# Patient Record
Sex: Female | Born: 1975 | Race: White | Hispanic: No | Marital: Single | Smoking: Never smoker
Health system: Southern US, Community
[De-identification: ages and names within clinical notes are randomized; demographics above are authoritative.]

## PROBLEM LIST (undated history)

## (undated) DIAGNOSIS — E282 Polycystic ovarian syndrome: Secondary | ICD-10-CM

## (undated) DIAGNOSIS — N189 Chronic kidney disease, unspecified: Secondary | ICD-10-CM

## (undated) DIAGNOSIS — E119 Type 2 diabetes mellitus without complications: Secondary | ICD-10-CM

## (undated) DIAGNOSIS — I10 Essential (primary) hypertension: Secondary | ICD-10-CM

## (undated) DIAGNOSIS — E042 Nontoxic multinodular goiter: Secondary | ICD-10-CM

## (undated) DIAGNOSIS — M51379 Other intervertebral disc degeneration, lumbosacral region without mention of lumbar back pain or lower extremity pain: Secondary | ICD-10-CM

## (undated) DIAGNOSIS — G8929 Other chronic pain: Secondary | ICD-10-CM

## (undated) DIAGNOSIS — M5137 Other intervertebral disc degeneration, lumbosacral region: Secondary | ICD-10-CM

## (undated) DIAGNOSIS — T8859XA Other complications of anesthesia, initial encounter: Secondary | ICD-10-CM

## (undated) DIAGNOSIS — Z87442 Personal history of urinary calculi: Secondary | ICD-10-CM

## (undated) DIAGNOSIS — D649 Anemia, unspecified: Secondary | ICD-10-CM

## (undated) DIAGNOSIS — F419 Anxiety disorder, unspecified: Secondary | ICD-10-CM

## (undated) DIAGNOSIS — J189 Pneumonia, unspecified organism: Secondary | ICD-10-CM

## (undated) DIAGNOSIS — F32A Depression, unspecified: Secondary | ICD-10-CM

---

## 1998-05-27 ENCOUNTER — Other Ambulatory Visit: Admission: RE | Admit: 1998-05-27 | Discharge: 1998-05-27 | Payer: Self-pay | Admitting: Obstetrics and Gynecology

## 1998-10-02 ENCOUNTER — Emergency Department (HOSPITAL_COMMUNITY): Admission: EM | Admit: 1998-10-02 | Discharge: 1998-10-02 | Payer: Self-pay | Admitting: *Deleted

## 1999-05-17 ENCOUNTER — Emergency Department (HOSPITAL_COMMUNITY): Admission: EM | Admit: 1999-05-17 | Discharge: 1999-05-17 | Payer: Self-pay | Admitting: Emergency Medicine

## 2003-06-08 ENCOUNTER — Emergency Department (HOSPITAL_COMMUNITY): Admission: EM | Admit: 2003-06-08 | Discharge: 2003-06-09 | Payer: Self-pay | Admitting: Emergency Medicine

## 2003-06-09 ENCOUNTER — Encounter: Payer: Self-pay | Admitting: Emergency Medicine

## 2006-07-11 ENCOUNTER — Emergency Department (HOSPITAL_COMMUNITY): Admission: EM | Admit: 2006-07-11 | Discharge: 2006-07-11 | Payer: Self-pay | Admitting: Family Medicine

## 2006-08-01 ENCOUNTER — Emergency Department (HOSPITAL_COMMUNITY): Admission: EM | Admit: 2006-08-01 | Discharge: 2006-08-01 | Payer: Self-pay | Admitting: Family Medicine

## 2006-11-15 ENCOUNTER — Emergency Department (HOSPITAL_COMMUNITY): Admission: EM | Admit: 2006-11-15 | Discharge: 2006-11-15 | Payer: Self-pay | Admitting: Family Medicine

## 2007-09-09 ENCOUNTER — Emergency Department (HOSPITAL_COMMUNITY): Admission: EM | Admit: 2007-09-09 | Discharge: 2007-09-09 | Payer: Self-pay | Admitting: Family Medicine

## 2008-06-23 ENCOUNTER — Emergency Department (HOSPITAL_BASED_OUTPATIENT_CLINIC_OR_DEPARTMENT_OTHER): Admission: EM | Admit: 2008-06-23 | Discharge: 2008-06-23 | Payer: Self-pay | Admitting: Emergency Medicine

## 2009-07-11 ENCOUNTER — Emergency Department (HOSPITAL_COMMUNITY): Admission: EM | Admit: 2009-07-11 | Discharge: 2009-07-11 | Payer: Self-pay | Admitting: Emergency Medicine

## 2010-03-23 ENCOUNTER — Emergency Department (HOSPITAL_BASED_OUTPATIENT_CLINIC_OR_DEPARTMENT_OTHER): Admission: EM | Admit: 2010-03-23 | Discharge: 2010-03-23 | Payer: Self-pay | Admitting: Emergency Medicine

## 2010-03-23 ENCOUNTER — Ambulatory Visit: Payer: Self-pay | Admitting: Diagnostic Radiology

## 2010-10-02 ENCOUNTER — Emergency Department (HOSPITAL_COMMUNITY)
Admission: EM | Admit: 2010-10-02 | Discharge: 2010-10-02 | Payer: Self-pay | Source: Home / Self Care | Admitting: Emergency Medicine

## 2010-12-29 LAB — URINALYSIS, ROUTINE W REFLEX MICROSCOPIC
Hgb urine dipstick: NEGATIVE
Specific Gravity, Urine: 1.011 (ref 1.005–1.030)
Urobilinogen, UA: 1 mg/dL (ref 0.0–1.0)
pH: 7 (ref 5.0–8.0)

## 2010-12-29 LAB — COMPREHENSIVE METABOLIC PANEL
ALT: 37 U/L — ABNORMAL HIGH (ref 0–35)
AST: 29 U/L (ref 0–37)
Albumin: 3.8 g/dL (ref 3.5–5.2)
Alkaline Phosphatase: 110 U/L (ref 39–117)
CO2: 27 mEq/L (ref 19–32)
Chloride: 102 mEq/L (ref 96–112)
GFR calc Af Amer: >60 mL/min (ref >60)
GFR calc non Af Amer: >60 mL/min (ref >60)
Potassium: 3.8 mEq/L (ref 3.5–5.1)
Sodium: 136 mEq/L (ref 135–145)
Total Bilirubin: 1.5 mg/dL — ABNORMAL HIGH (ref 0.3–1.2)

## 2010-12-29 LAB — DIFFERENTIAL
Basophils Absolute: 0.1 10*3/uL (ref 0.0–0.1)
Eosinophils Absolute: 0.2 10*3/uL (ref 0.0–0.7)
Eosinophils Relative: 3 % (ref 0–5)
Lymphocytes Relative: 20 % (ref 12–46)
Monocytes Absolute: 0.4 10*3/uL (ref 0.1–1.0)

## 2010-12-29 LAB — HEMOCCULT GUIAC POC 1CARD (OFFICE): Fecal Occult Bld: NEGATIVE

## 2010-12-29 LAB — CBC
MCV: 88.6 fL (ref 78.0–100.0)
Platelets: 208 10*3/uL (ref 150–400)
RBC: 4.91 MIL/uL (ref 3.87–5.11)
WBC: 8.5 10*3/uL (ref 4.0–10.5)

## 2010-12-29 LAB — URINE MICROSCOPIC-ADD ON

## 2011-06-26 LAB — CBC
HCT: 41.5
MCV: 87.5
Platelets: 259
RDW: 12
WBC: 8.8

## 2011-06-26 LAB — POCT CARDIAC MARKERS
CKMB, poc: 2.2
Myoglobin, poc: 43.1
Myoglobin, poc: 50.2
Troponin i, poc: <0.05

## 2011-06-26 LAB — DIFFERENTIAL
Eosinophils Relative: 2
Lymphocytes Relative: 28
Monocytes Absolute: 0.5
Monocytes Relative: 6
Neutro Abs: 5.7

## 2011-06-26 LAB — COMPREHENSIVE METABOLIC PANEL
AST: 32
Albumin: 4.6
BUN: 12
Chloride: 106
Creatinine, Ser: 0.8
GFR calc Af Amer: >60
Potassium: 3.7
Total Protein: 8

## 2011-06-26 LAB — PROTIME-INR
INR: 1
Prothrombin Time: 13

## 2011-06-30 LAB — POCT URINALYSIS DIP (DEVICE)
Glucose, UA: NEGATIVE
Ketones, ur: NEGATIVE
Operator id: 235561
Protein, ur: NEGATIVE

## 2011-06-30 LAB — POCT RAPID STREP A: Streptococcus, Group A Screen (Direct): NEGATIVE

## 2012-02-24 ENCOUNTER — Emergency Department (HOSPITAL_COMMUNITY)
Admission: EM | Admit: 2012-02-24 | Discharge: 2012-02-24 | Disposition: A | Discharge status: Home / Self Care | Payer: Self-pay | Attending: Emergency Medicine | Admitting: Emergency Medicine

## 2012-02-24 ENCOUNTER — Encounter (HOSPITAL_COMMUNITY): Payer: Self-pay | Admitting: Emergency Medicine

## 2012-02-24 DIAGNOSIS — I10 Essential (primary) hypertension: Secondary | ICD-10-CM | POA: Insufficient documentation

## 2012-02-24 DIAGNOSIS — R51 Headache: Secondary | ICD-10-CM | POA: Insufficient documentation

## 2012-02-24 HISTORY — DX: Essential (primary) hypertension: I10

## 2012-02-24 MED ORDER — METOCLOPRAMIDE HCL 5 MG/ML IJ SOLN
10.0000 mg | Freq: Once | INTRAMUSCULAR | Status: AC
  Administered 2012-02-24: 10 mg via INTRAMUSCULAR
  Filled 2012-02-24: qty 2

## 2012-02-24 MED ORDER — DEXAMETHASONE SODIUM PHOSPHATE 10 MG/ML IJ SOLN
10.0000 mg | Freq: Once | INTRAMUSCULAR | Status: AC
  Administered 2012-02-24: 10 mg via INTRAMUSCULAR
  Filled 2012-02-24: qty 1

## 2012-02-24 MED ORDER — DIPHENHYDRAMINE HCL 50 MG/ML IJ SOLN
25.0000 mg | Freq: Once | INTRAMUSCULAR | Status: AC
  Administered 2012-02-24: 25 mg via INTRAMUSCULAR
  Filled 2012-02-24: qty 1

## 2012-02-24 NOTE — ED Provider Notes (Signed)
History     CSN: [id number]  Arrival date & time 02/24/12  2028   First MD Initiated Contact with Patient 02/24/12 2131      Chief Complaint  Patient presents with  . Headache    (Consider location/radiation/quality/duration/timing/severity/associated sxs/prior treatment) HPI Comments: Patient reports that she has had intermittent headaches over the past 2 months.  She has had some increased stress at work.  Pain is located in the temporal region bilaterally and goes around her head in a band like pattern.  Pain is typically relieved with Ibuprofen.  She denies any ataxia, vision changes, fever, neck pain/stiffness, dizziness, or lightheadedness.  No head injury.  She reports that the pain today is similar to the pain that she has had in the past.  She reports that she had an episode of vomiting this morning.. She is requesting a note for missing work today.   Patient is a 36 y.o. female presenting with headaches. The history is provided by the patient.  Headache  This is a recurrent problem. The headache is associated with nothing. Pain location: bilateral temporal region, band like pain around head. The pain does not radiate. Associated symptoms include nausea and vomiting. Pertinent negatives include no fever, no near-syncope, no syncope and no shortness of breath. She has tried NSAIDs for the symptoms. The treatment provided mild relief.    Past Medical History  Diagnosis Date  . Hypertension     History reviewed. No pertinent past surgical history.  Family History  Problem Relation Age of Onset  . Diabetes Mother   . Heart failure Father   . Cancer Father     History  Substance Use Topics  . Smoking status: Never Smoker   . Smokeless tobacco: Not on file  . Alcohol Use: No    OB History    Grav Para Term Preterm Abortions TAB SAB Ect Mult Living                  Review of Systems  Constitutional: Negative for fever and chills.  HENT: Negative for neck pain and  neck stiffness.   Eyes: Negative for photophobia, pain and visual disturbance.  Respiratory: Negative for shortness of breath.   Cardiovascular: Negative for syncope and near-syncope.  Gastrointestinal: Positive for nausea and vomiting. Negative for abdominal pain.  Musculoskeletal: Negative for gait problem.  Skin: Negative for rash.  Neurological: Positive for headaches. Negative for dizziness, tremors, syncope, facial asymmetry, speech difficulty, weakness, light-headedness and numbness.  Psychiatric/Behavioral: Negative for confusion.    Allergies  Review of patient's allergies indicates no known allergies.  Home Medications   Current Outpatient Rx  Name Route Sig Dispense Refill  . FLUOXETINE HCL 40 MG PO CAPS Oral Take 40 mg by mouth daily.    . IBUPROFEN 200 MG PO TABS Oral Take 600 mg by mouth every 6 (six) hours as needed. Pain      BP 146/79  Pulse 89  Temp(Src) 98.6 F (37 C) (Oral)  Resp 18  SpO2 98%  Physical Exam  Nursing note and vitals reviewed. Constitutional: She is oriented to person, place, and time. She appears well-developed and well-nourished. No distress.  HENT:  Head: Normocephalic and atraumatic.  Mouth/Throat: Oropharynx is clear and moist.  Eyes: EOM are normal. Pupils are equal, round, and reactive to light.  Neck: Normal range of motion. Neck supple.  Cardiovascular: Normal rate, regular rhythm and normal heart sounds.   Pulmonary/Chest: Effort normal and breath sounds normal.  Abdominal:  There is no tenderness.  Musculoskeletal: Normal range of motion.  Neurological: She is alert and oriented to person, place, and time. She has normal strength. No cranial nerve deficit or sensory deficit. She displays a negative Romberg sign. Coordination and gait normal.  Reflex Scores:      Brachioradialis reflexes are 2+ on the right side and 2+ on the left side.      Patellar reflexes are 2+ on the right side and 2+ on the left side.      Normal finger  to nose testing No ataxia with ambulation Normal rapid alternating movements Normal visual fields  Skin: Skin is warm and dry. No rash noted. She is not diaphoretic.  Psychiatric: She has a normal mood and affect.    ED Course  Procedures (including critical care time)  Labs Reviewed - No data to display No results found.   No diagnosis found.  11:16 PM Reassessed patient.  She reports that her headache has improved at this time.  MDM  Pt HA treated and improved while in ED.  Presentation is like pts typical HA and non concerning for [facility], ICH, Meningitis, or temporal arteritis. Pt is afebrile with no focal neuro deficits, nuchal rigidity, or change in vision. Pt is to follow up with PCP. Pt verbalizes understanding and is agreeable with plan to dc.         Sherlyn Lees Corning, PA-C 02/25/12 1159

## 2012-02-24 NOTE — Discharge Instructions (Signed)
General Headache, Without Cause A general headache has no specific cause. These headaches are not life-threatening. They will not lead to other types of headaches. HOME CARE   Make and keep follow-up visits with your doctor.   Only take medicine as told by your doctor.   Try to relax, get a massage, or use your thoughts to control your body (biofeedback).   Apply cold or heat to the head and neck. Apply 3 or 4 times a day or as needed.   GET HELP RIGHT AWAY IF:   You have problems with medicine.   Your medicine does not help relieve pain.   Your headache changes or becomes worse.   You feel sick to your stomach (nauseous) or throw up (vomit).   You have a temperature by mouth above 102 F (38.9 C), not controlled by medicine.   Your have a stiff neck.   You have vision loss.   You have muscle weakness.   You lose control of your muscles.   You lose balance or have trouble walking.   You feel like you are going to pass out (faint).  MAKE SURE YOU:   Understand these instructions.   Will watch this condition.   Will get help right away if you are not doing well or get worse.  Document Released: 06/20/2008 Document Revised: 08/31/2011 Document Reviewed: 06/20/2008 [facility] Patient Information 2012 [place].

## 2012-02-24 NOTE — ED Notes (Signed)
Pt c/o headache x 2 months. Pt states woke this morning and vomiting x 2 since then.

## 2012-02-24 NOTE — ED Notes (Signed)
Pt c/o headache x2 months. Pt states headache will persist for 2-3 days and then subside for a 1-2 days before returning. Pt states pain is in temple are bilaterally. Pt states she vomited twice this morning.

## 2012-02-25 NOTE — ED Provider Notes (Signed)
Medical screening examination/treatment/procedure(s) were performed by non-physician practitioner and as supervising physician I was immediately available for consultation/collaboration.  Leota Jacobsen, MD 02/25/12 [phone]

## 2012-03-27 ENCOUNTER — Encounter (HOSPITAL_COMMUNITY): Payer: Self-pay | Admitting: Emergency Medicine

## 2012-03-27 ENCOUNTER — Emergency Department (HOSPITAL_COMMUNITY)
Admission: EM | Admit: 2012-03-27 | Discharge: 2012-03-28 | Disposition: A | Discharge status: Home / Self Care | Payer: Self-pay | Attending: Emergency Medicine | Admitting: Emergency Medicine

## 2012-03-27 DIAGNOSIS — K047 Periapical abscess without sinus: Secondary | ICD-10-CM | POA: Insufficient documentation

## 2012-03-27 DIAGNOSIS — Z79899 Other long term (current) drug therapy: Secondary | ICD-10-CM | POA: Insufficient documentation

## 2012-03-27 HISTORY — DX: Polycystic ovarian syndrome: E28.2

## 2012-03-27 MED ORDER — TRAMADOL HCL 50 MG PO TABS
50.0000 mg | ORAL_TABLET | Freq: Once | ORAL | Status: AC
  Administered 2012-03-28: 50 mg via ORAL
  Filled 2012-03-27: qty 1

## 2012-03-27 MED ORDER — AMOXICILLIN 500 MG PO CAPS
500.0000 mg | ORAL_CAPSULE | Freq: Once | ORAL | Status: AC
  Administered 2012-03-28: 500 mg via ORAL
  Filled 2012-03-27: qty 1

## 2012-03-27 MED ORDER — AMOXICILLIN 500 MG PO CAPS: 500.0000 mg | ORAL_CAPSULE | Freq: Three times a day (TID) | ORAL | Status: AC

## 2012-03-27 MED ORDER — TRAMADOL HCL 50 MG PO TABS: 50.0000 mg | ORAL_TABLET | Freq: Four times a day (QID) | ORAL | Status: AC | PRN

## 2012-03-27 NOTE — ED Provider Notes (Signed)
History     CSN: [id number]  Arrival date & time 03/27/12  2315   First MD Initiated Contact with Patient 03/27/12 2329      Chief Complaint  Patient presents with  . Abscess    (Consider location/radiation/quality/duration/timing/severity/associated sxs/prior treatment) HPI Comments: Patient here with left lower jaw facial swelling and dental pain - she states that she thinks she has a bad tooth - states pain has worsened over the past 2 days and now there is swelling - she states has been unable to brush her teeth - is able to open mouth, denies sublingual swelling or pain, difficulty swallowing, fever or chills.  Patient is a 36 y.o. female presenting with abscess. The history is provided by the patient. No language interpreter was used.  Abscess  This is a new problem. The current episode started yesterday. The onset was gradual. The problem occurs rarely. The problem has been unchanged. The abscess is present on the face. The problem is severe. The abscess is characterized by painfulness and swelling. The patient was exposed to OTC medications. The abscess first occurred at home. Associated symptoms include anorexia and decreased sleep. Pertinent negatives include no decrease in physical activity, not drinking less, no fever, no fussiness, not sleeping more, no diarrhea, no vomiting, no congestion, no rhinorrhea, no sore throat, no decreased responsiveness and no cough. There were no sick contacts. She has received no recent medical care.    Past Medical History  Diagnosis Date  . Hypertension   . Polycystic ovarian syndrome     History reviewed. No pertinent past surgical history.  Family History  Problem Relation Age of Onset  . Diabetes Mother   . Heart failure Father   . Cancer Father     History  Substance Use Topics  . Smoking status: Never Smoker   . Smokeless tobacco: Not on file  . Alcohol Use: Yes     social    OB History    Grav Para Term Preterm Abortions  TAB SAB Ect Mult Living                  Review of Systems  Constitutional: Negative for fever, chills and decreased responsiveness.  HENT: Positive for ear pain, facial swelling and dental problem. Negative for congestion, sore throat and rhinorrhea.   Eyes: Negative for pain.  Respiratory: Negative for cough.   Cardiovascular: Negative for chest pain.  Gastrointestinal: Positive for anorexia. Negative for vomiting and diarrhea.  Genitourinary: Negative for flank pain.  Musculoskeletal: Negative for back pain.  Skin: Negative for wound.  All other systems reviewed and are negative.    Allergies  Review of patient's allergies indicates no known allergies.  Home Medications   Current Outpatient Rx  Name Route Sig Dispense Refill  . FLUOXETINE HCL 40 MG PO CAPS Oral Take 40 mg by mouth daily.    . IBUPROFEN 200 MG PO TABS Oral Take 600 mg by mouth every 6 (six) hours as needed. Pain      BP 159/109  Pulse 86  Temp 98.4 F (36.9 C) (Oral)  Resp 20  SpO2 99%  Physical Exam  Nursing note and vitals reviewed. Constitutional: She is oriented to person, place, and time. She appears well-developed and well-nourished.       crying  HENT:  Head: Atraumatic. No trismus in the jaw.    Right Ear: External ear normal.  Left Ear: External ear normal.  Nose: Nose normal.  Mouth/Throat: Oropharynx is clear  and moist and mucous membranes are normal. Abnormal dentition. Dental abscesses and dental caries present. No uvula swelling. No oropharyngeal exudate.    Eyes: Conjunctivae are normal. Pupils are equal, round, and reactive to light. No scleral icterus.  Neck: Normal range of motion. Neck supple.  Cardiovascular: Normal rate, regular rhythm and normal heart sounds.  Exam reveals no gallop and no friction rub.   No murmur heard. Pulmonary/Chest: Effort normal and breath sounds normal. No respiratory distress. She has no wheezes. She has no rales. She exhibits no tenderness.    Abdominal: Soft. Bowel sounds are normal. She exhibits no distension. There is no tenderness.  Musculoskeletal: Normal range of motion. She exhibits no edema and no tenderness.  Lymphadenopathy:    She has no cervical adenopathy.  Neurological: She is alert and oriented to person, place, and time. No cranial nerve deficit. She exhibits normal muscle tone. Coordination normal.  Skin: Skin is warm and dry. No rash noted. No erythema. No pallor.  Psychiatric: She has a normal mood and affect. Her behavior is normal. Judgment and thought content normal.    ED Course  Procedures (including critical care time)  Labs Reviewed - No data to display No results found.   Dental abscess   MDM  Patient with abscess to left 2nd premolar on the bottom with associated swelling to the buccal mucosa c/w dental abscess        Idalia Needle. [place], [place] 03/27/12 2349

## 2012-03-27 NOTE — ED Notes (Signed)
Pt states she has an abscess  Pt states she has a bad tooth and today she noticed swelling and increased pain to the left side of her face  Pt is tearful in triage

## 2012-03-28 NOTE — ED Provider Notes (Signed)
Medical screening examination/treatment/procedure(s) were performed by non-physician practitioner and as supervising physician I was immediately available for consultation/collaboration.  Richarda Blade, MD 03/28/12 [phone]

## 2015-02-23 ENCOUNTER — Emergency Department (HOSPITAL_COMMUNITY)
Admission: EM | Admit: 2015-02-23 | Discharge: 2015-02-23 | Disposition: A | Discharge status: Home / Self Care | Payer: 59 | Attending: Emergency Medicine | Admitting: Emergency Medicine

## 2015-02-23 ENCOUNTER — Encounter (HOSPITAL_COMMUNITY): Payer: Self-pay | Admitting: Emergency Medicine

## 2015-02-23 DIAGNOSIS — A084 Viral intestinal infection, unspecified: Secondary | ICD-10-CM

## 2015-02-23 DIAGNOSIS — Z3202 Encounter for pregnancy test, result negative: Secondary | ICD-10-CM | POA: Diagnosis not present

## 2015-02-23 DIAGNOSIS — I10 Essential (primary) hypertension: Secondary | ICD-10-CM | POA: Insufficient documentation

## 2015-02-23 DIAGNOSIS — Z8639 Personal history of other endocrine, nutritional and metabolic disease: Secondary | ICD-10-CM | POA: Diagnosis not present

## 2015-02-23 DIAGNOSIS — R112 Nausea with vomiting, unspecified: Secondary | ICD-10-CM | POA: Diagnosis present

## 2015-02-23 LAB — COMPREHENSIVE METABOLIC PANEL
ALT: 39 U/L (ref 14–54)
AST: 28 U/L (ref 15–41)
Albumin: 3.8 g/dL (ref 3.5–5.0)
Alkaline Phosphatase: 123 U/L (ref 38–126)
Anion gap: 8 (ref 5–15)
BUN: 11 mg/dL (ref 6–20)
CALCIUM: 8.8 mg/dL — AB (ref 8.9–10.3)
CO2: 24 mmol/L (ref 22–32)
Chloride: 106 mmol/L (ref 101–111)
Creatinine, Ser: 0.67 mg/dL (ref 0.44–1.00)
GFR calc non Af Amer: >60 mL/min (ref >60)
GLUCOSE: 112 mg/dL — AB (ref 65–99)
Potassium: 3.4 mmol/L — ABNORMAL LOW (ref 3.5–5.1)
SODIUM: 138 mmol/L (ref 135–145)
TOTAL PROTEIN: 7.6 g/dL (ref 6.5–8.1)
Total Bilirubin: 1.1 mg/dL (ref 0.3–1.2)

## 2015-02-23 LAB — URINE MICROSCOPIC-ADD ON

## 2015-02-23 LAB — CBC WITH DIFFERENTIAL/PLATELET
BASOS PCT: 1 % (ref 0–1)
Basophils Absolute: 0 10*3/uL (ref 0.0–0.1)
EOS PCT: 2 % (ref 0–5)
Eosinophils Absolute: 0.2 10*3/uL (ref 0.0–0.7)
HEMATOCRIT: 37.7 % (ref 36.0–46.0)
HEMOGLOBIN: 13.2 g/dL (ref 12.0–15.0)
Lymphocytes Relative: 28 % (ref 12–46)
Lymphs Abs: 2.2 10*3/uL (ref 0.7–4.0)
MCH: 29.5 pg (ref 26.0–34.0)
MCHC: 35 g/dL (ref 30.0–36.0)
MCV: 84.3 fL (ref 78.0–100.0)
Monocytes Absolute: 0.6 10*3/uL (ref 0.1–1.0)
Monocytes Relative: 8 % (ref 3–12)
Neutro Abs: 4.7 10*3/uL (ref 1.7–7.7)
Neutrophils Relative %: 61 % (ref 43–77)
PLATELETS: 191 10*3/uL (ref 150–400)
RBC: 4.47 MIL/uL (ref 3.87–5.11)
RDW: 12.4 % (ref 11.5–15.5)
WBC: 7.7 10*3/uL (ref 4.0–10.5)

## 2015-02-23 LAB — URINALYSIS, ROUTINE W REFLEX MICROSCOPIC
Bilirubin Urine: NEGATIVE
GLUCOSE, UA: NEGATIVE mg/dL
Ketones, ur: NEGATIVE mg/dL
LEUKOCYTES UA: NEGATIVE
NITRITE: NEGATIVE
PH: 6 (ref 5.0–8.0)
Protein, ur: NEGATIVE mg/dL
SPECIFIC GRAVITY, URINE: 1.016 (ref 1.005–1.030)
Urobilinogen, UA: 0.2 mg/dL (ref 0.0–1.0)

## 2015-02-23 LAB — PREGNANCY, URINE: Preg Test, Ur: NEGATIVE

## 2015-02-23 LAB — LIPASE, BLOOD: Lipase: 19 U/L — ABNORMAL LOW (ref 22–51)

## 2015-02-23 MED ORDER — ONDANSETRON HCL 4 MG PO TABS
4.0000 mg | ORAL_TABLET | Freq: Four times a day (QID) | ORAL | Status: DC
Start: 2015-02-23 — End: 2017-10-01

## 2015-02-23 MED ORDER — ONDANSETRON HCL 4 MG/2ML IJ SOLN
4.0000 mg | Freq: Once | INTRAMUSCULAR | Status: AC
  Administered 2015-02-23: 4 mg via INTRAVENOUS
  Filled 2015-02-23: qty 2

## 2015-02-23 MED ORDER — METOCLOPRAMIDE HCL 5 MG/ML IJ SOLN
10.0000 mg | INTRAMUSCULAR | Status: AC
  Administered 2015-02-23: 10 mg via INTRAVENOUS
  Filled 2015-02-23: qty 2

## 2015-02-23 MED ORDER — LACTINEX PO PACK: PACK | ORAL | Status: DC

## 2015-02-23 MED ORDER — SODIUM CHLORIDE 0.9 % IV BOLUS (SEPSIS)
1000.0000 mL | Freq: Once | INTRAVENOUS | Status: AC
  Administered 2015-02-23: 1000 mL via INTRAVENOUS

## 2015-02-23 MED ORDER — KETOROLAC TROMETHAMINE 30 MG/ML IJ SOLN: 30.0000 mg | Freq: Once | INTRAMUSCULAR | Status: DC

## 2015-02-23 NOTE — ED Notes (Signed)
Pt states she started having diarrhea on Friday  Pt states she had it through the weekend and started having vomiting yesterday  Pt states she has had nausea since Thursday night

## 2015-02-23 NOTE — ED Provider Notes (Signed)
CSN: [id number]     Arrival date & time 02/23/15  0002 History   First MD Initiated Contact with Patient 02/23/15 0051     Chief Complaint  Patient presents with  . Diarrhea  . Emesis    (Consider location/radiation/quality/duration/timing/severity/associated sxs/prior Treatment) HPI Comments: Patient is a 39 year old female with a history of hypertension and polycystic ovarian syndrome. She presents to the emergency department for further evaluation of vomiting and diarrhea. Patient reports that she began to experience anorexia 4 days ago. The following day she developed watery, nonbloody diarrhea. Diarrhea has persisted and vomiting started yesterday. Patient has had approximately 4 episodes of emesis today. Emesis has been watery. Patient reports a low-grade temperature of 100.9F. She has abdominal pain which she describes as "nausea pain" in her epigastric abdomen. Patient took ibuprofen for symptoms with some improvement. She states that her father is sick with vomiting and diarrhea as well. Patient denies a history of abdominal surgeries. No reported chest pain, shortness of breath, dysuria, hematuria, vaginal bleeding or discharge, melanoma, hematochezia, and hematemesis.  Patient is a 39 y.o. female presenting with diarrhea and vomiting. The history is provided by the patient. No language interpreter was used.  Diarrhea Associated symptoms: abdominal pain and vomiting   Emesis Associated symptoms: abdominal pain and diarrhea     Past Medical History  Diagnosis Date  . Hypertension   . Polycystic ovarian syndrome    History reviewed. No pertinent past surgical history. Family History  Problem Relation Age of Onset  . Diabetes Mother   . Heart failure Father   . Cancer Father   . Stroke Father    History  Substance Use Topics  . Smoking status: Never Smoker   . Smokeless tobacco: Not on file  . Alcohol Use: Yes     Comment: social   OB History    No data available       Review of Systems  Constitutional: Positive for appetite change.  Gastrointestinal: Positive for nausea, vomiting, abdominal pain and diarrhea.  All other systems reviewed and are negative.   Allergies  Bee venom  Home Medications   Prior to Admission medications   Medication Sig Start Date End Date Taking? Authorizing Provider  ibuprofen (ADVIL,MOTRIN) 200 MG tablet Take 800 mg by mouth every 6 (six) hours as needed. Pain    Yes Historical Provider, MD  Lactobacillus (LACTINEX) PACK Mix 1/2 packet with soft food. Take every 12 hours for 5 days. 02/23/15   Antonietta Breach, PA-C  ondansetron (ZOFRAN) 4 MG tablet Take 1 tablet (4 mg total) by mouth every 6 (six) hours. 02/23/15   Antonietta Breach, PA-C   BP 166/97 mmHg  Pulse 80  Temp(Src) 98.6 F (37 C) (Oral)  Resp 16  SpO2 98%   Physical Exam  Constitutional: She is oriented to person, place, and time. She appears well-developed and well-nourished. No distress.  Nontoxic/nonseptic appearing  HENT:  Head: Normocephalic and atraumatic.  Eyes: Conjunctivae and EOM are normal. No scleral icterus.  Neck: Normal range of motion.  Cardiovascular: Normal rate, regular rhythm and intact distal pulses.   Pulmonary/Chest: Effort normal and breath sounds normal. No respiratory distress. She has no wheezes. She has no rales.  Lungs CTAB  Abdominal: Soft. She exhibits no distension. There is tenderness. There is no rebound and no guarding.  Soft obese abdomen with mild TTP in the epigastric region. No masses or evidence of peritonitis. Exam limited secondary to body habitus.  Musculoskeletal: Normal range of motion.  Neurological: She is alert and oriented to person, place, and time. She exhibits normal muscle tone. Coordination normal.  GCS 15. Patient moves extremities without ataxia.  Skin: Skin is warm and dry. No rash noted. She is not diaphoretic. No erythema. No pallor.  Psychiatric: She has a normal mood and affect. Her behavior is  normal.  Nursing note and vitals reviewed.   ED Course  Procedures (including critical care time) Labs Review Labs Reviewed  COMPREHENSIVE METABOLIC PANEL - Abnormal; Notable for the following:    Potassium 3.4 (*)    Glucose, Bld 112 (*)    Calcium 8.8 (*)    All other components within normal limits  URINALYSIS, ROUTINE W REFLEX MICROSCOPIC (NOT AT [facility]) - Abnormal; Notable for the following:    Hgb urine dipstick TRACE (*)    All other components within normal limits  LIPASE, BLOOD - Abnormal; Notable for the following:    Lipase 19 (*)    All other components within normal limits  URINE MICROSCOPIC-ADD ON - Abnormal; Notable for the following:    Squamous Epithelial / LPF FEW (*)    All other components within normal limits  CBC WITH DIFFERENTIAL/PLATELET  PREGNANCY, URINE    Imaging Review No results found.   EKG Interpretation None      MDM   Final diagnoses:  Viral gastroenteritis    Patient with symptoms consistent with viral gastroenteritis. Hx of contact with father who has had similar symptoms. Vitals are stable, no fever. No signs of dehydration, tolerating PO fluids > 6 oz. Lungs are clear. There is mild epigastric TTP; this is stable on reexamination. Given reassuring laboratory work up, doubt emergent intraabdominal process such as appendicitis, cholecystitis, pancreatitis, ruptured viscus, UTI, kidney stone, pSBO or SBO. Supportive therapy indicated with return if symptoms worsen. Return precautions provided at discharge. Patient agreeable to plan with no unaddressed concerns. Patient discharged in good condition.   Filed Vitals:   02/23/15 0013 02/23/15 0256  BP: 155/69 166/97  Pulse: 104 80  Temp: 98.4 F (36.9 C) 98.6 F (37 C)  TempSrc: Oral Oral  Resp: 18 16  SpO2: 97% 98%      Antonietta Breach, PA-C 02/23/15 0302  Pamella Pert, MD 02/23/15 [phone]

## 2015-02-23 NOTE — ED Notes (Signed)
Patient is alert and oriented x3.  She was given DC instructions and follow up visit instructions.  Patient gave verbal understanding. She was DC ambulatory under her own power to home.  V/S stable.  He was not showing any signs of distress on DC 

## 2015-02-23 NOTE — Discharge Instructions (Signed)

## 2016-03-24 ENCOUNTER — Telehealth: Payer: Self-pay | Admitting: General Practice

## 2016-03-24 NOTE — Telephone Encounter (Signed)
We can move her into an earlier 30 min slot for an established pt physical if she would like

## 2016-03-24 NOTE — Telephone Encounter (Signed)
Pt says that she was seen by her diabetic Dr. And was told that they have some concerns and would like for her to be seen by a primary provider sooner then when scheduled. Pt is nervous and would like to know if provider could see her sooner than scheduled?    Please advise for scheduling  CB: [phone]

## 2016-04-03 NOTE — Telephone Encounter (Signed)
Not showing any sooner available new patient slots available to push up appt.

## 2016-04-12 ENCOUNTER — Telehealth: Payer: Self-pay

## 2016-04-12 NOTE — Telephone Encounter (Signed)
Pre Visit call made to patient. Left message for call back.

## 2016-04-13 ENCOUNTER — Ambulatory Visit: Payer: 59 | Admitting: Family Medicine

## 2016-04-13 DIAGNOSIS — Z0289 Encounter for other administrative examinations: Secondary | ICD-10-CM

## 2017-10-01 ENCOUNTER — Emergency Department (HOSPITAL_BASED_OUTPATIENT_CLINIC_OR_DEPARTMENT_OTHER): Payer: Self-pay

## 2017-10-01 ENCOUNTER — Inpatient Hospital Stay (HOSPITAL_BASED_OUTPATIENT_CLINIC_OR_DEPARTMENT_OTHER): Payer: Self-pay

## 2017-10-01 ENCOUNTER — Inpatient Hospital Stay (HOSPITAL_BASED_OUTPATIENT_CLINIC_OR_DEPARTMENT_OTHER)
Admission: EM | Admit: 2017-10-01 | Discharge: 2017-10-04 | DRG: 194 | Disposition: A | Discharge status: Home / Self Care | Payer: Self-pay | Attending: Internal Medicine | Admitting: Internal Medicine

## 2017-10-01 ENCOUNTER — Encounter (HOSPITAL_BASED_OUTPATIENT_CLINIC_OR_DEPARTMENT_OTHER): Payer: Self-pay

## 2017-10-01 ENCOUNTER — Other Ambulatory Visit: Payer: Self-pay

## 2017-10-01 DIAGNOSIS — R748 Abnormal levels of other serum enzymes: Secondary | ICD-10-CM | POA: Diagnosis present

## 2017-10-01 DIAGNOSIS — E876 Hypokalemia: Secondary | ICD-10-CM | POA: Diagnosis present

## 2017-10-01 DIAGNOSIS — I10 Essential (primary) hypertension: Secondary | ICD-10-CM | POA: Diagnosis present

## 2017-10-01 DIAGNOSIS — R791 Abnormal coagulation profile: Secondary | ICD-10-CM | POA: Diagnosis present

## 2017-10-01 DIAGNOSIS — Z8249 Family history of ischemic heart disease and other diseases of the circulatory system: Secondary | ICD-10-CM

## 2017-10-01 DIAGNOSIS — E119 Type 2 diabetes mellitus without complications: Secondary | ICD-10-CM

## 2017-10-01 DIAGNOSIS — R778 Other specified abnormalities of plasma proteins: Secondary | ICD-10-CM | POA: Diagnosis present

## 2017-10-01 DIAGNOSIS — D72829 Elevated white blood cell count, unspecified: Secondary | ICD-10-CM

## 2017-10-01 DIAGNOSIS — E871 Hypo-osmolality and hyponatremia: Secondary | ICD-10-CM | POA: Diagnosis present

## 2017-10-01 DIAGNOSIS — R000 Tachycardia, unspecified: Secondary | ICD-10-CM | POA: Diagnosis present

## 2017-10-01 DIAGNOSIS — R0902 Hypoxemia: Secondary | ICD-10-CM | POA: Diagnosis present

## 2017-10-01 DIAGNOSIS — R7989 Other specified abnormal findings of blood chemistry: Secondary | ICD-10-CM

## 2017-10-01 DIAGNOSIS — J181 Lobar pneumonia, unspecified organism: Principal | ICD-10-CM | POA: Diagnosis present

## 2017-10-01 DIAGNOSIS — Z833 Family history of diabetes mellitus: Secondary | ICD-10-CM

## 2017-10-01 DIAGNOSIS — N390 Urinary tract infection, site not specified: Secondary | ICD-10-CM | POA: Diagnosis present

## 2017-10-01 DIAGNOSIS — E282 Polycystic ovarian syndrome: Secondary | ICD-10-CM | POA: Diagnosis present

## 2017-10-01 DIAGNOSIS — J189 Pneumonia, unspecified organism: Secondary | ICD-10-CM | POA: Diagnosis present

## 2017-10-01 HISTORY — DX: Type 2 diabetes mellitus without complications: E11.9

## 2017-10-01 LAB — CBC WITH DIFFERENTIAL/PLATELET
BAND NEUTROPHILS: 4 %
BASOS ABS: 0 10*3/uL (ref 0.0–0.1)
BASOS PCT: 0 %
EOS ABS: 0 10*3/uL (ref 0.0–0.7)
Eosinophils Relative: 0 %
HEMATOCRIT: 39.4 % (ref 36.0–46.0)
HEMOGLOBIN: 14.2 g/dL (ref 12.0–15.0)
LYMPHS ABS: 1.2 10*3/uL (ref 0.7–4.0)
Lymphocytes Relative: 6 %
MCH: 30 pg (ref 26.0–34.0)
MCHC: 36 g/dL (ref 30.0–36.0)
MCV: 83.1 fL (ref 78.0–100.0)
Monocytes Absolute: 1.4 10*3/uL — ABNORMAL HIGH (ref 0.1–1.0)
Monocytes Relative: 7 %
Neutro Abs: 16.7 10*3/uL — ABNORMAL HIGH (ref 1.7–7.7)
Neutrophils Relative %: 83 %
Platelets: 177 10*3/uL (ref 150–400)
RBC: 4.74 MIL/uL (ref 3.87–5.11)
RDW: 12.9 % (ref 11.5–15.5)
WBC: 19.3 10*3/uL — ABNORMAL HIGH (ref 4.0–10.5)

## 2017-10-01 LAB — INFLUENZA PANEL BY PCR (TYPE A & B)
INFLBPCR: NEGATIVE
Influenza A By PCR: NEGATIVE

## 2017-10-01 LAB — D-DIMER, QUANTITATIVE (NOT AT ARMC): D DIMER QUANT: 1.35 ug{FEU}/mL — AB (ref 0.00–0.50)

## 2017-10-01 LAB — URINALYSIS, ROUTINE W REFLEX MICROSCOPIC
Bilirubin Urine: NEGATIVE
Glucose, UA: NEGATIVE mg/dL
Ketones, ur: NEGATIVE mg/dL
Nitrite: POSITIVE — AB
PH: 6 (ref 5.0–8.0)
Protein, ur: 30 mg/dL — AB
SPECIFIC GRAVITY, URINE: 1.01 (ref 1.005–1.030)

## 2017-10-01 LAB — COMPREHENSIVE METABOLIC PANEL
ALK PHOS: 162 U/L — AB (ref 38–126)
ALT: 18 U/L (ref 14–54)
AST: 21 U/L (ref 15–41)
Albumin: 3.1 g/dL — ABNORMAL LOW (ref 3.5–5.0)
Anion gap: 10 (ref 5–15)
BUN: 10 mg/dL (ref 6–20)
CO2: 22 mmol/L (ref 22–32)
CREATININE: 0.91 mg/dL (ref 0.44–1.00)
Calcium: 8.9 mg/dL (ref 8.9–10.3)
Chloride: 102 mmol/L (ref 101–111)
GFR calc non Af Amer: >60 mL/min (ref >60)
GLUCOSE: 228 mg/dL — AB (ref 65–99)
Potassium: 3.4 mmol/L — ABNORMAL LOW (ref 3.5–5.1)
Sodium: 134 mmol/L — ABNORMAL LOW (ref 135–145)
Total Bilirubin: 1.9 mg/dL — ABNORMAL HIGH (ref 0.3–1.2)
Total Protein: 7.5 g/dL (ref 6.5–8.1)

## 2017-10-01 LAB — I-STAT CG4 LACTIC ACID, ED
Lactic Acid, Venous: 2.42 mmol/L (ref 0.5–1.9)
Lactic Acid, Venous: 2 mmol/L (ref 0.5–1.9)

## 2017-10-01 LAB — URINALYSIS, MICROSCOPIC (REFLEX)

## 2017-10-01 LAB — TROPONIN I: TROPONIN I: 0.04 ng/mL — AB (ref <0.03)

## 2017-10-01 MED ORDER — ACETAMINOPHEN 500 MG PO TABS
1000.0000 mg | ORAL_TABLET | Freq: Once | ORAL | Status: AC
  Administered 2017-10-01: 1000 mg via ORAL
  Filled 2017-10-01: qty 2

## 2017-10-01 MED ORDER — DEXTROSE 5 % IV SOLN
1.0000 g | INTRAVENOUS | Status: DC
  Administered 2017-10-02 – 2017-10-03 (×2): 1 g via INTRAVENOUS
  Filled 2017-10-01 (×2): qty 10

## 2017-10-01 MED ORDER — SODIUM CHLORIDE 0.9 % IV BOLUS (SEPSIS)
1000.0000 mL | Freq: Once | INTRAVENOUS | Status: AC
  Administered 2017-10-01: 1000 mL via INTRAVENOUS

## 2017-10-01 MED ORDER — IOPAMIDOL (ISOVUE-370) INJECTION 76%
100.0000 mL | Freq: Once | INTRAVENOUS | Status: AC | PRN
  Administered 2017-10-01: 80 mL via INTRAVENOUS

## 2017-10-01 MED ORDER — INSULIN ASPART 100 UNIT/ML ~~LOC~~ SOLN
0.0000 [IU] | Freq: Three times a day (TID) | SUBCUTANEOUS | Status: DC
  Administered 2017-10-02: 3 [IU] via SUBCUTANEOUS
  Administered 2017-10-02: 5 [IU] via SUBCUTANEOUS
  Administered 2017-10-02: 3 [IU] via SUBCUTANEOUS
  Administered 2017-10-03 (×3): 2 [IU] via SUBCUTANEOUS
  Administered 2017-10-04: 1 [IU] via SUBCUTANEOUS
  Administered 2017-10-04: 3 [IU] via SUBCUTANEOUS
  Administered 2017-10-04: 2 [IU] via SUBCUTANEOUS

## 2017-10-01 MED ORDER — ENSURE ENLIVE PO LIQD
237.0000 mL | Freq: Two times a day (BID) | ORAL | Status: DC
  Administered 2017-10-02: 237 mL via ORAL

## 2017-10-01 MED ORDER — AZITHROMYCIN 250 MG PO TABS
500.0000 mg | ORAL_TABLET | Freq: Once | ORAL | Status: AC
  Administered 2017-10-01: 500 mg via ORAL
  Filled 2017-10-01: qty 2

## 2017-10-01 MED ORDER — INSULIN ASPART 100 UNIT/ML ~~LOC~~ SOLN
0.0000 [IU] | Freq: Every day | SUBCUTANEOUS | Status: DC
  Administered 2017-10-02: 2 [IU] via SUBCUTANEOUS

## 2017-10-01 MED ORDER — DEXTROSE 5 % IV SOLN
1.0000 g | Freq: Once | INTRAVENOUS | Status: AC
  Administered 2017-10-01: 1 g via INTRAVENOUS
  Filled 2017-10-01: qty 10

## 2017-10-01 NOTE — ED Notes (Signed)
amb to [facility] for u/a

## 2017-10-01 NOTE — ED Notes (Signed)
Informed pt that her bed has been assigned but that nurse is unable to give her a definite time as to when she will be transferred.  Pt rolled her eyes, but says she will stay to get admitted.

## 2017-10-01 NOTE — ED Triage Notes (Signed)
C/o flu like sx x 1 week-presents to triage in w/c-last dose ibuprofen 30 min PTA

## 2017-10-01 NOTE — Progress Notes (Signed)
42 yo female with sore throat, and  Cough x 2 week and now having fever,  chills, dyspnea.  Pt tachycardic 120's and febrile and found to have CAP on CXR.  ED requesting admission  For CAP

## 2017-10-01 NOTE — ED Notes (Signed)
EDP at the bedside.  Patient states unsure if she would like to be admitted to hospital.  Patient to let staff know when she makes her decision.

## 2017-10-01 NOTE — H&P (Signed)
TRH H&P   Patient Demographics:    Karen Gibson, is a 42 y.o. female  MRN: [id number]   DOB - [birth year]  Admit Date - 10/01/2017  Outpatient Primary MD for the patient is Patient, No Pcp Per  Referring MD/NP/PA: Quintella Reichert  Outpatient Specialists:     Patient coming from: home=> [facility]  Chief Complaint  Patient presents with  . Cough      HPI:    Karen Gibson  is a 42 y.o. female, w PCOS, Hypertension, Dm2 apparently c/o cough w yellow sputum for the past 2 weeks.  Woke up this am with increase in dyspnea and fever.  Pt denies cp, palp, n/v, diarrhea, brbpr.  Pt notes she has been in contact with her family who have had pneumonia and bronchitis.  Pt presented to [facility] due to dyspnea.    In ED,  CTA chest IMPRESSION: 1. Multi-lobar Left Lung Pneumonia, worst in the lingula. No associated left pleural effusion. Mild reactive appearing left hilar lymph nodes. 2. No pulmonary embolus identified. 3. Followup PA and lateral chest X-ray is recommended in 3-4 weeks following trial of antibiotic therapy to ensure resolution and exclude underlying malignancy.  Na 134, K 3.4 Glucose 228 BUn 10, Creatinine 0.91  Ast 21, Alt 18 Alk phos 162, T. Bili 1.9  Wbc 19.3, Hgb 14.2, Plt 177  Influenza panel negative  Trop I 0.04 D dimer 1.35  ua Wbc 6-30, Rbc 6-30  Pt will be admitted for CAP, UTI, tachycardia, hypokalemia, hyponatremia.    Review of systems:    In addition to the HPI above,     No Headache, No changes with Vision or hearing, No problems swallowing food or Liquids, No Chest pain, No Abdominal pain, No Nausea or Vommitting, Bowel movements are regular, No Blood in stool or Urine, No dysuria, No new skin rashes or bruises, No new joints pains-aches,  No new weakness, tingling, numbness in any extremity, No recent weight gain  or loss, No polyuria, polydypsia or polyphagia, No significant Mental Stressors.  A full 10 point Review of Systems was done, except as stated above, all other Review of Systems were negative.   With Past History of the following :    Past Medical History:  Diagnosis Date  . Diabetes mellitus without complication ([place])   . Hypertension   . Polycystic ovarian syndrome       History reviewed. No pertinent surgical history.    Social History:     Social History   Tobacco Use  . Smoking status: Never Smoker  . Smokeless tobacco: Never Used  Substance Use Topics  . Alcohol use: Yes    Comment: occ     Lives - at home  Mobility - walks by self   Family History :     Family History  Problem Relation Age of Onset  .  Diabetes Mother   . Heart failure Father   . Cancer Father   . Stroke Father       Home Medications:   Prior to Admission medications   Not on File     Allergies:     Allergies  Allergen Reactions  . Bee Venom Other (See Comments)    Shortness of breath/throat swells.     Physical Exam:   Vitals  Blood pressure 124/75, pulse (!) 129, temperature 99.1 F (37.3 C), temperature source Oral, resp. rate (!) 22, height '5\' 1"'$  (1.549 m), weight (!) 165.6 kg (365 lb), SpO2 96 %.   1. General  lying in bed in NAD,    2. Normal affect and insight, Not Suicidal or Homicidal, Awake Alert, Oriented X 3.  3. No F.N deficits, ALL C.Nerves Intact, Strength 5/5 all 4 extremities, Sensation intact all 4 extremities, Plantars down going.  4. Ears and Eyes appear Normal, Conjunctivae clear, PERRLA. Moist Oral Mucosa.  5. Supple Neck, No JVD, No cervical lymphadenopathy appriciated, No Carotid Bruits.  6. Symmetrical Chest wall movement, Good air movement bilaterally, slight crackles left lung base, no wheezing. .  7. Tachy s1, s2, no m/g/r   8. Positive Bowel Sounds, Abdomen Soft, No tenderness, No organomegaly appriciated,No rebound -guarding or  rigidity.  9.  No Cyanosis, Normal Skin Turgor, No Skin Rash or Bruise.  10. Good muscle tone,  joints appear normal , no effusions, Normal ROM.  11. No Palpable Lymph Nodes in Neck or Axillae     Data Review:    CBC Recent Labs  Lab 10/01/17 1524  WBC 19.3*  HGB 14.2  HCT 39.4  PLT 177  MCV 83.1  MCH 30.0  MCHC 36.0  RDW 12.9  LYMPHSABS 1.2  MONOABS 1.4*  EOSABS 0.0  BASOSABS 0.0   ------------------------------------------------------------------------------------------------------------------  Chemistries  Recent Labs  Lab 10/01/17 1524  NA 134*  K 3.4*  CL 102  CO2 22  GLUCOSE 228*  BUN 10  CREATININE 0.91  CALCIUM 8.9  AST 21  ALT 18  ALKPHOS 162*  BILITOT 1.9*   ------------------------------------------------------------------------------------------------------------------ estimated creatinine clearance is 123.1 mL/min (by C-G formula based on SCr of 0.91 mg/dL). ------------------------------------------------------------------------------------------------------------------ No results for input(s): TSH, T4TOTAL, T3FREE, THYROIDAB in the last 72 hours.  Invalid input(s): FREET3  Coagulation profile No results for input(s): INR, PROTIME in the last 168 hours. ------------------------------------------------------------------------------------------------------------------- Recent Labs    10/01/17 1525  DDIMER 1.35*   -------------------------------------------------------------------------------------------------------------------  Cardiac Enzymes Recent Labs  Lab 10/01/17 1525  TROPONINI 0.04*   ------------------------------------------------------------------------------------------------------------------ No results found for: BNP   ---------------------------------------------------------------------------------------------------------------  Urinalysis    Component Value Date/Time   COLORURINE YELLOW 10/01/2017 1700    APPEARANCEUR CLEAR 10/01/2017 1700   LABSPEC 1.010 10/01/2017 1700   PHURINE 6.0 10/01/2017 1700   GLUCOSEU NEGATIVE 10/01/2017 1700   HGBUR LARGE (A) 10/01/2017 1700   BILIRUBINUR NEGATIVE 10/01/2017 1700   KETONESUR NEGATIVE 10/01/2017 1700   PROTEINUR 30 (A) 10/01/2017 1700   UROBILINOGEN 0.2 02/23/2015 0133   NITRITE POSITIVE (A) 10/01/2017 1700   LEUKOCYTESUR MODERATE (A) 10/01/2017 1700    ----------------------------------------------------------------------------------------------------------------   Imaging Results:    Dg Chest 2 View  Result Date: 10/01/2017 CLINICAL DATA:  Cough, fever, shortness of breath, and chest congestion for the past week. EXAM: CHEST  2 VIEW COMPARISON:  Portable chest x-ray of June 23, 2008 FINDINGS: There is dense infiltrate in the anterior aspect of the left lower lobe and in the lingula. The right  lung is clear. The heart and pulmonary vascularity are normal. The mediastinum is normal in width. There is mild multilevel degenerative disc disease of the thoracic spine. IMPRESSION: Lingular and left lower lobe pneumonia. Followup PA and lateral chest X-ray is recommended in 3-4 weeks following trial of antibiotic therapy to ensure resolution and exclude underlying malignancy. Electronically Signed   By: David  Martinique M.D.   On: 10/01/2017 14:31   Ct Angio Chest Pe W/cm &/or Wo Cm  Result Date: 10/01/2017 CLINICAL DATA:  42 year old female with cough for 2 weeks. Fever, shortness of breath and tachycardia. Abnormal D-dimer. EXAM: CT ANGIOGRAPHY CHEST WITH CONTRAST TECHNIQUE: Multidetector CT imaging of the chest was performed using the standard protocol during bolus administration of intravenous contrast. Multiplanar CT image reconstructions and MIPs were obtained to evaluate the vascular anatomy. CONTRAST:  66m ISOVUE-370 IOPAMIDOL (ISOVUE-370) INJECTION 76% COMPARISON:  Chest radiographs 1400 hours today. FINDINGS: Cardiovascular: Adequate contrast  bolus timing in the pulmonary arterial tree. Mild respiratory motion, more pronounced in the left lung. No convincing pulmonary artery filling defect. Borderline to mild cardiomegaly. No pericardial effusion. Negative visible aorta. No calcified coronary artery atherosclerosis is evident. Mediastinum/Nodes: Mild asymmetric enlargement of left hilar lymph nodes. No mediastinal lymphadenopathy. Lungs/Pleura: Widespread lingula consolidation with superimposed multifocal peribronchial nodularity in the superior segment of the left lower lobe, and peripheral consolidation in the posterior basal segment of the lower lobe. Widespread peribronchial nodularity in the left upper lobe. Early consolidation involving the inferior aspect of the left upper lobe. No superimpose pleural effusion. Major airways are patent. No abnormal right lung opacity. Upper Abdomen: Negative visible liver, gallbladder, spleen, pancreas, adrenal glands, and bowel. Musculoskeletal: Age advanced degenerative endplate changes in the thoracic spine. No acute osseous abnormality identified. Review of the MIP images confirms the above findings. IMPRESSION: 1. Multi-lobar Left Lung Pneumonia, worst in the lingula. No associated left pleural effusion. Mild reactive appearing left hilar lymph nodes. 2. No pulmonary embolus identified. 3. Followup PA and lateral chest X-ray is recommended in 3-4 weeks following trial of antibiotic therapy to ensure resolution and exclude underlying malignancy. Electronically Signed   By: HGenevie AnnM.D.   On: 10/01/2017 19:25    st at 130, nl axis, no st-t changes c/w ischemia   Assessment & Plan:    Principal Problem:   CAP (community acquired pneumonia) Active Problems:   Tachycardia   Elevated troponin   Hypokalemia   Diabetes mellitus without complication (HCC)   Leukocytosis    CAP Blood culture x2 Sputum culture Urine strep, urine legionella antigen resp virus panel Rocephin 1gm iv qday, zithromax  '500mg'$  iv qday  Tachycardia Tele Trop I q6h x3 Check tsh Check cardia echo  Hypokalemia Replete Check cmp in am  Leukocytosis secondary to CAP Check cbc in am  Dm2 fsbs ac and qhs, ISS   DVT Prophylaxis Lovenox - SCDs   AM Labs Ordered, also please review Full Orders  Family Communication: Admission, patients condition and plan of care including tests being ordered have been discussed with the patient  who indicate understanding and agree with the plan and Code Status.  Code Status FULL CODE  Likely DC to  home  Condition GUARDED   Consults called:  none  Admission status: inpatient  Time spent in minutes :45   JJani GravelM.D on 10/01/2017 at 11:32 PM  Between 7am to 7pm - Pager - [phone]  After 7pm go to [email or web address] - password [email or web address]  [facility] - Office  [phone]

## 2017-10-01 NOTE — ED Notes (Signed)
Pt does not want to stay in the ED anymore, EDP informed.

## 2017-10-01 NOTE — ED Notes (Signed)
Pt informed that there are beds at [facility], it will still be a few hours for the transfer.  She wanted to see if [facility] had beds, which will still take a few hours.  Pt is not interested in waiting.

## 2017-10-01 NOTE — ED Notes (Signed)
RT for assessment

## 2017-10-01 NOTE — ED Provider Notes (Signed)
[facility] Provider Note   CSN: [id number] Arrival date & time: 10/01/17  1320     History   Chief Complaint Chief Complaint  Patient presents with  . Cough    HPI Karen Gibson is a 42 y.o. female.  The history is provided by the patient. No language interpreter was used.  Cough    Karen Gibson is a 42 y.o. female who presents to the Emergency Department complaining of cough.  She presents accompanied by family for evaluation of cough, sore throat, chills.  She has been sick for 2 weeks with sore throat, cough productive of yellow-green sputum.  Today she developed chills and worsening symptoms.  She has experienced nausea but no vomiting.  She does have a history of hypertension but has not been on any medications for the last 6-7 months.  Symptoms are moderate to severe, constant, worsening. Past Medical History:  Diagnosis Date  . Diabetes mellitus without complication ([place])   . Hypertension   . Polycystic ovarian syndrome     Patient Active Problem List   Diagnosis Date Noted  . CAP (community acquired pneumonia) 10/01/2017  . Tachycardia 10/01/2017  . Elevated troponin 10/01/2017  . Hypokalemia 10/01/2017  . Diabetes mellitus without complication ([place]) 40/04/6760  . Leukocytosis 10/01/2017    History reviewed. No pertinent surgical history.  OB History    No data available       Home Medications    Prior to Admission medications   Medication Sig Start Date End Date Taking? Authorizing Provider  ibuprofen (ADVIL,MOTRIN) 200 MG tablet Take 600 mg by mouth every 6 (six) hours as needed for moderate pain.   Yes [provider]    Family History Family History  Problem Relation Age of Onset  . Diabetes Mother   . Heart failure Father   . Cancer Father   . Stroke Father     Social History Social History   Tobacco Use  . Smoking status: Never Smoker  . Smokeless tobacco: Never Used  Substance  Use Topics  . Alcohol use: Yes    Comment: occ  . Drug use: No     Allergies   Bee venom   Review of Systems Review of Systems  Respiratory: Positive for cough.   All other systems reviewed and are negative.    Physical Exam Updated Vital Signs BP 124/75 (BP Location: Right Arm)   Pulse (!) 129   Temp 99.1 F (37.3 C) (Oral)   Resp (!) 22   Ht 5\' 1"  (1.549 m)   Wt (!) 165.6 kg (365 lb)   SpO2 96%   BMI 68.97 kg/m   Physical Exam  Constitutional: She is oriented to person, place, and time. She appears well-developed and well-nourished.  obese  HENT:  Head: Normocephalic and atraumatic.  Mild erythema in posterior OP, dry mucous membranes  Cardiovascular: Regular rhythm.  No murmur heard. Tachycardic  Pulmonary/Chest: Effort normal. No respiratory distress.  Distant breath sounds bilaterally  Abdominal: Soft. There is no tenderness. There is no rebound and no guarding.  Musculoskeletal: She exhibits no edema or tenderness.  Neurological: She is alert and oriented to person, place, and time.  Skin: Skin is warm and dry.  Psychiatric: She has a normal mood and affect. Her behavior is normal.  Nursing note and vitals reviewed.    ED Treatments / Results  Labs (all labs ordered are listed, but only abnormal results are displayed) Labs Reviewed  COMPREHENSIVE METABOLIC PANEL - Abnormal; Notable for the following components:      Result Value   Sodium 134 (*)    Potassium 3.4 (*)    Glucose, Bld 228 (*)    Albumin 3.1 (*)    Alkaline Phosphatase 162 (*)    Total Bilirubin 1.9 (*)    All other components within normal limits  CBC WITH DIFFERENTIAL/PLATELET - Abnormal; Notable for the following components:   WBC 19.3 (*)    Neutro Abs 16.7 (*)    Monocytes Absolute 1.4 (*)    All other components within normal limits  URINALYSIS, ROUTINE W REFLEX MICROSCOPIC - Abnormal; Notable for the following components:   Hgb urine dipstick LARGE (*)    Protein, ur 30  (*)    Nitrite POSITIVE (*)    Leukocytes, UA MODERATE (*)    All other components within normal limits  TROPONIN I - Abnormal; Notable for the following components:   Troponin I 0.04 (*)    All other components within normal limits  URINALYSIS, MICROSCOPIC (REFLEX) - Abnormal; Notable for the following components:   Bacteria, UA FEW (*)    Squamous Epithelial / LPF 0-5 (*)    All other components within normal limits  D-DIMER, QUANTITATIVE (NOT AT [facility]) - Abnormal; Notable for the following components:   D-Dimer, Quant 1.35 (*)    All other components within normal limits  GLUCOSE, CAPILLARY - Abnormal; Notable for the following components:   Glucose-Capillary 238 (*)    All other components within normal limits  I-STAT CG4 LACTIC ACID, ED - Abnormal; Notable for the following components:   Lactic Acid, Venous 2.42 (*)    All other components within normal limits  I-STAT CG4 LACTIC ACID, ED - Abnormal; Notable for the following components:   Lactic Acid, Venous 2.00 (*)    All other components within normal limits  CULTURE, BLOOD (ROUTINE X 2)  CULTURE, BLOOD (ROUTINE X 2)  CULTURE, BLOOD (ROUTINE X 2)  CULTURE, BLOOD (ROUTINE X 2)  CULTURE, EXPECTORATED SPUTUM-ASSESSMENT  GRAM STAIN  RESPIRATORY PANEL BY PCR  CULTURE, EXPECTORATED SPUTUM-ASSESSMENT  INFLUENZA PANEL BY PCR (TYPE A & B)  TROPONIN I  STREP PNEUMONIAE URINARY ANTIGEN  CBC  COMPREHENSIVE METABOLIC PANEL  LEGIONELLA PNEUMOPHILA SEROGP 1 UR AG  TROPONIN I  TROPONIN I  HIV ANTIBODY (ROUTINE TESTING)    EKG  EKG Interpretation  Date/Time:  Monday October 01 2017 15:33:28 EST Ventricular Rate:  137 PR Interval:    QRS Duration: 86 QT Interval:  292 QTC Calculation: 441 R Axis:   50 Text Interpretation:  Sinus tachycardia Consider right atrial enlargement Borderline T abnormalities, inferior leads Confirmed by Quintella Reichert [phone]) on 10/01/2017 3:47:16 PM Also confirmed by Quintella Reichert [phone]), editor  Philomena Doheny [phone])  on 10/01/2017 4:23:36 PM       Radiology Dg Chest 2 View  Result Date: 10/01/2017 CLINICAL DATA:  Cough, fever, shortness of breath, and chest congestion for the past week. EXAM: CHEST  2 VIEW COMPARISON:  Portable chest x-ray of June 23, 2008 FINDINGS: There is dense infiltrate in the anterior aspect of the left lower lobe and in the lingula. The right lung is clear. The heart and pulmonary vascularity are normal. The mediastinum is normal in width. There is mild multilevel degenerative disc disease of the thoracic spine. IMPRESSION: Lingular and left lower lobe pneumonia. Followup PA and lateral chest X-ray is recommended in 3-4 weeks following trial of antibiotic therapy to ensure resolution  and exclude underlying malignancy. Electronically Signed   By: David  Martinique M.D.   On: 10/01/2017 14:31   Ct Angio Chest Pe W/cm &/or Wo Cm  Result Date: 10/01/2017 CLINICAL DATA:  43 year old female with cough for 2 weeks. Fever, shortness of breath and tachycardia. Abnormal D-dimer. EXAM: CT ANGIOGRAPHY CHEST WITH CONTRAST TECHNIQUE: Multidetector CT imaging of the chest was performed using the standard protocol during bolus administration of intravenous contrast. Multiplanar CT image reconstructions and MIPs were obtained to evaluate the vascular anatomy. CONTRAST:  46mL ISOVUE-370 IOPAMIDOL (ISOVUE-370) INJECTION 76% COMPARISON:  Chest radiographs 1400 hours today. FINDINGS: Cardiovascular: Adequate contrast bolus timing in the pulmonary arterial tree. Mild respiratory motion, more pronounced in the left lung. No convincing pulmonary artery filling defect. Borderline to mild cardiomegaly. No pericardial effusion. Negative visible aorta. No calcified coronary artery atherosclerosis is evident. Mediastinum/Nodes: Mild asymmetric enlargement of left hilar lymph nodes. No mediastinal lymphadenopathy. Lungs/Pleura: Widespread lingula consolidation with superimposed multifocal peribronchial  nodularity in the superior segment of the left lower lobe, and peripheral consolidation in the posterior basal segment of the lower lobe. Widespread peribronchial nodularity in the left upper lobe. Early consolidation involving the inferior aspect of the left upper lobe. No superimpose pleural effusion. Major airways are patent. No abnormal right lung opacity. Upper Abdomen: Negative visible liver, gallbladder, spleen, pancreas, adrenal glands, and bowel. Musculoskeletal: Age advanced degenerative endplate changes in the thoracic spine. No acute osseous abnormality identified. Review of the MIP images confirms the above findings. IMPRESSION: 1. Multi-lobar Left Lung Pneumonia, worst in the lingula. No associated left pleural effusion. Mild reactive appearing left hilar lymph nodes. 2. No pulmonary embolus identified. 3. Followup PA and lateral chest X-ray is recommended in 3-4 weeks following trial of antibiotic therapy to ensure resolution and exclude underlying malignancy. Electronically Signed   By: Genevie Ann M.D.   On: 10/01/2017 19:25    Procedures Procedures (including critical care time) CRITICAL CARE Performed by: Quintella Reichert   Total critical care time: 35 minutes  Critical care time was exclusive of separately billable procedures and treating other patients.  Critical care was necessary to treat or prevent imminent or life-threatening deterioration.  Critical care was time spent personally by me on the following activities: development of treatment plan with patient and/or surrogate as well as nursing, discussions with consultants, evaluation of patient's response to treatment, examination of patient, obtaining history from patient or surrogate, ordering and performing treatments and interventions, ordering and review of laboratory studies, ordering and review of radiographic studies, pulse oximetry and re-evaluation of patient's condition.  Medications Ordered in ED Medications    cefTRIAXone (ROCEPHIN) 1 g in dextrose 5 % 50 mL IVPB (not administered)  enoxaparin (LOVENOX) injection 80 mg (80 mg Subcutaneous Given 10/02/17 0036)  0.9 %  sodium chloride infusion ( Intravenous New Bag/Given 10/02/17 0019)  azithromycin (ZITHROMAX) 500 mg in dextrose 5 % 250 mL IVPB (not administered)  insulin aspart (novoLOG) injection 0-9 Units (not administered)  insulin aspart (novoLOG) injection 0-5 Units (2 Units Subcutaneous Given 10/02/17 0036)  feeding supplement (ENSURE ENLIVE) (ENSURE ENLIVE) liquid 237 mL (not administered)  chlorpheniramine-HYDROcodone (TUSSIONEX) 10-8 MG/5ML suspension 5 mL (5 mLs Oral Given 10/02/17 0135)  phenol (CHLORASEPTIC) mouth spray 1 spray (1 spray Mouth/Throat Given 10/02/17 0135)  cefTRIAXone (ROCEPHIN) 1 g in dextrose 5 % 50 mL IVPB (0 g Intravenous Stopped 10/01/17 1624)  acetaminophen (TYLENOL) tablet 1,000 mg (1,000 mg Oral Given 10/01/17 1539)  sodium chloride 0.9 % bolus 1,000 mL (  0 mLs Intravenous Stopped 10/01/17 1637)  azithromycin (ZITHROMAX) tablet 500 mg (500 mg Oral Given 10/01/17 1552)  sodium chloride 0.9 % bolus 1,000 mL (0 mLs Intravenous Stopped 10/01/17 1738)  iopamidol (ISOVUE-370) 76 % injection 100 mL (80 mLs Intravenous Contrast Given 10/01/17 1909)  potassium chloride SA (K-DUR,KLOR-CON) CR tablet 40 mEq (40 mEq Oral Given 10/02/17 0036)     Initial Impression / Assessment and Plan / ED Course  I have reviewed the triage vital signs and the nursing notes.  Pertinent labs & imaging results that were available during my care of the patient were reviewed by me and considered in my medical decision making (see chart for details).     Patient here for evaluation of body aches, sore throat cough for the last 2 weeks, fevers today.  Patient is tachycardic in the emergency department but nontoxic appearing.  Chest x-ray demonstrates left lower lobe pneumonia.  She was treated with IV fluids and antibiotics.  She does have a persistent tachycardia  despite IV fluids with normal blood pressure and normal mentation.  Discussed with patient serious nature of illness or recommendation for admission for further treatment and she is in agreement with plan.  Hospitalist consulted for admission for further treatment.    Final Clinical Impressions(s) / ED Diagnoses   Final diagnoses:  Community acquired pneumonia of left lower lobe of lung [facility])    ED Discharge Orders    None       Quintella Reichert, MD 10/02/17 [phone]

## 2017-10-01 NOTE — ED Notes (Addendum)
Pt came back from the bathroom very SOB, heart rate was 168 and pulse ox was 89%.  Informed pt of this and she still has not decided if she wants to stay.  EDP notified as well.

## 2017-10-02 ENCOUNTER — Inpatient Hospital Stay (HOSPITAL_COMMUNITY): Payer: Self-pay

## 2017-10-02 ENCOUNTER — Encounter (HOSPITAL_COMMUNITY): Payer: Self-pay

## 2017-10-02 DIAGNOSIS — I361 Nonrheumatic tricuspid (valve) insufficiency: Secondary | ICD-10-CM

## 2017-10-02 LAB — RESPIRATORY PANEL BY PCR
ADENOVIRUS-RVPPCR: NOT DETECTED
Bordetella pertussis: NOT DETECTED
CORONAVIRUS 229E-RVPPCR: NOT DETECTED
CORONAVIRUS NL63-RVPPCR: NOT DETECTED
CORONAVIRUS OC43-RVPPCR: NOT DETECTED
Chlamydophila pneumoniae: NOT DETECTED
Coronavirus HKU1: NOT DETECTED
INFLUENZA B-RVPPCR: NOT DETECTED
Influenza A: NOT DETECTED
METAPNEUMOVIRUS-RVPPCR: NOT DETECTED
MYCOPLASMA PNEUMONIAE-RVPPCR: NOT DETECTED
PARAINFLUENZA VIRUS 1-RVPPCR: NOT DETECTED
PARAINFLUENZA VIRUS 2-RVPPCR: NOT DETECTED
Parainfluenza Virus 3: NOT DETECTED
Parainfluenza Virus 4: NOT DETECTED
RESPIRATORY SYNCYTIAL VIRUS-RVPPCR: NOT DETECTED
Rhinovirus / Enterovirus: NOT DETECTED

## 2017-10-02 LAB — COMPREHENSIVE METABOLIC PANEL
ALBUMIN: 2.8 g/dL — AB (ref 3.5–5.0)
ALT: 17 U/L (ref 14–54)
AST: 20 U/L (ref 15–41)
Alkaline Phosphatase: 141 U/L — ABNORMAL HIGH (ref 38–126)
Anion gap: 7 (ref 5–15)
BILIRUBIN TOTAL: 1.3 mg/dL — AB (ref 0.3–1.2)
BUN: 12 mg/dL (ref 6–20)
CO2: 23 mmol/L (ref 22–32)
CREATININE: 1 mg/dL (ref 0.44–1.00)
Calcium: 8.6 mg/dL — ABNORMAL LOW (ref 8.9–10.3)
Chloride: 105 mmol/L (ref 101–111)
GFR calc Af Amer: >60 mL/min (ref >60)
GLUCOSE: 243 mg/dL — AB (ref 65–99)
Potassium: 3.4 mmol/L — ABNORMAL LOW (ref 3.5–5.1)
Sodium: 135 mmol/L (ref 135–145)
TOTAL PROTEIN: 6.9 g/dL (ref 6.5–8.1)

## 2017-10-02 LAB — GLUCOSE, CAPILLARY
GLUCOSE-CAPILLARY: 258 mg/dL — AB (ref 65–99)
GLUCOSE-CAPILLARY: 209 mg/dL — AB (ref 65–99)
Glucose-Capillary: 198 mg/dL — ABNORMAL HIGH (ref 65–99)
Glucose-Capillary: 238 mg/dL — ABNORMAL HIGH (ref 65–99)
Glucose-Capillary: 250 mg/dL — ABNORMAL HIGH (ref 65–99)

## 2017-10-02 LAB — CBC
HEMATOCRIT: 34.2 % — AB (ref 36.0–46.0)
HEMOGLOBIN: 11.9 g/dL — AB (ref 12.0–15.0)
MCH: 29.8 pg (ref 26.0–34.0)
MCHC: 34.8 g/dL (ref 30.0–36.0)
MCV: 85.5 fL (ref 78.0–100.0)
Platelets: 172 10*3/uL (ref 150–400)
RBC: 4 MIL/uL (ref 3.87–5.11)
RDW: 13.1 % (ref 11.5–15.5)
WBC: 17.7 10*3/uL — ABNORMAL HIGH (ref 4.0–10.5)

## 2017-10-02 LAB — HIV ANTIBODY (ROUTINE TESTING W REFLEX): HIV SCREEN 4TH GENERATION: NONREACTIVE

## 2017-10-02 LAB — EXPECTORATED SPUTUM ASSESSMENT W GRAM STAIN, RFLX TO RESP C: Special Requests: NORMAL

## 2017-10-02 LAB — ECHOCARDIOGRAM COMPLETE
Height: 61 in
Weight: 5840 oz

## 2017-10-02 LAB — STREP PNEUMONIAE URINARY ANTIGEN: STREP PNEUMO URINARY ANTIGEN: NEGATIVE

## 2017-10-02 LAB — TROPONIN I
Troponin I: <0.03 ng/mL (ref <0.03)
Troponin I: <0.03 ng/mL (ref <0.03)

## 2017-10-02 MED ORDER — POTASSIUM CHLORIDE CRYS ER 20 MEQ PO TBCR
40.0000 meq | EXTENDED_RELEASE_TABLET | Freq: Once | ORAL | Status: AC
  Administered 2017-10-02: 40 meq via ORAL
  Filled 2017-10-02: qty 2

## 2017-10-02 MED ORDER — SODIUM CHLORIDE 0.9 % IV SOLN
INTRAVENOUS | Status: AC
  Administered 2017-10-02: via INTRAVENOUS

## 2017-10-02 MED ORDER — OXYCODONE-ACETAMINOPHEN 5-325 MG PO TABS
1.0000 | ORAL_TABLET | Freq: Four times a day (QID) | ORAL | Status: DC | PRN
  Administered 2017-10-02 – 2017-10-04 (×4): 1 via ORAL
  Filled 2017-10-02 (×5): qty 1

## 2017-10-02 MED ORDER — PHENOL 1.4 % MT LIQD
1.0000 | OROMUCOSAL | Status: DC | PRN
  Administered 2017-10-02: 1 via OROMUCOSAL
  Filled 2017-10-02: qty 177

## 2017-10-02 MED ORDER — DEXTROSE 5 % IV SOLN
500.0000 mg | INTRAVENOUS | Status: DC
  Administered 2017-10-02 – 2017-10-03 (×2): 500 mg via INTRAVENOUS
  Filled 2017-10-02 (×2): qty 500

## 2017-10-02 MED ORDER — HYDROCOD POLST-CPM POLST ER 10-8 MG/5ML PO SUER
5.0000 mL | Freq: Two times a day (BID) | ORAL | Status: DC | PRN
  Administered 2017-10-02 – 2017-10-03 (×3): 5 mL via ORAL
  Filled 2017-10-02 (×3): qty 5

## 2017-10-02 MED ORDER — DEXTROSE 5 % IV SOLN: 1.0000 g | INTRAVENOUS | Status: DC

## 2017-10-02 MED ORDER — ALBUTEROL SULFATE (2.5 MG/3ML) 0.083% IN NEBU: 2.5000 mg | INHALATION_SOLUTION | RESPIRATORY_TRACT | Status: DC | PRN

## 2017-10-02 MED ORDER — PREMIER PROTEIN SHAKE
11.0000 [oz_av] | Freq: Two times a day (BID) | ORAL | Status: DC
  Administered 2017-10-02: 11 [oz_av] via ORAL
  Filled 2017-10-02 (×5): qty 325.31

## 2017-10-02 MED ORDER — MENTHOL 3 MG MT LOZG
1.0000 | LOZENGE | OROMUCOSAL | Status: DC | PRN
  Administered 2017-10-03 (×2): 3 mg via ORAL
  Filled 2017-10-02 (×2): qty 9

## 2017-10-02 MED ORDER — ACETAMINOPHEN 325 MG PO TABS
650.0000 mg | ORAL_TABLET | Freq: Four times a day (QID) | ORAL | Status: DC | PRN
  Administered 2017-10-02: 650 mg via ORAL
  Filled 2017-10-02: qty 2

## 2017-10-02 MED ORDER — ENOXAPARIN SODIUM 80 MG/0.8ML ~~LOC~~ SOLN
80.0000 mg | Freq: Every day | SUBCUTANEOUS | Status: DC
  Administered 2017-10-02 – 2017-10-03 (×3): 80 mg via SUBCUTANEOUS
  Filled 2017-10-02 (×3): qty 0.8

## 2017-10-02 NOTE — Progress Notes (Signed)
PROGRESS NOTE    Karen Gibson  OZD:[id number] DOB: [birth year] DOA: 10/01/2017 PCP: Patient, No Pcp Per  Brief Narrative: 42 y.o. female, w PCOS, Hypertension, Dm2 apparently c/o cough w yellow sputum for the past 2 weeks.  Woke up this am with increase in dyspnea and fever.  Pt denies cp, palp, n/v, diarrhea, brbpr.  Pt notes she has been in contact with her family who have had pneumonia and bronchitis.  Pt presented to [facility] due to dyspnea.   In ED,  CTA chest IMPRESSION: 1. Multi-lobar Left Lung Pneumonia, worst in the lingula. No associated left pleural effusion. Mild reactive appearing left hilar lymph nodes. 2. No pulmonary embolus identified. 3. Followup PA and lateral chest X-ray is recommended in 3-4 weeks following trial of antibiotic therapy to ensure resolution and exclude underlying malignancy.  Na 134, K 3.4 Glucose 228 BUn 10, Creatinine 0.91  Ast 21, Alt 18 Alk phos 162, T. Bili 1.9  Wbc 19.3, Hgb 14.2, Plt 177  Influenza panel negative  Trop I 0.04 D dimer 1.35  ua Wbc 6-30, Rbc 6-30  Pt will be admitted for CAP, UTI, tachycardia, hypokalemia, hyponatremia.      Assessment & Plan:   Principal Problem:   CAP (community acquired pneumonia) Active Problems:   Tachycardia   Elevated troponin   Hypokalemia   Diabetes mellitus without complication (HCC)   Leukocytosis 1] community-acquired multi lobar  pneumonia patient is currently on Rocephin and azithromycin.  She seems to be doing better.  Follow-up final urine culture and blood culture and sputum culture.  Her influenza a and B panel is negative RSV panel is negative can DC the isolation.  Patient was tachycardic on admission which has been resolved at this time cardiac enzymes have been negative telemetry no abnormal rhythms so far.  Echocardiogram has been done today the results are pending.  Need repeat chest x-ray in 4-6 weeks.  Type 2 diabetes-diet controlled she does not  take any medications at home.  Hypokalemia replete and recheck    DVT prophylaxis: Lovenox Code Status: Full code Family Communication: Discussed with patient when family was in the room Disposition Plan: I am hoping that she can be discharged in the next 24-48 hours provided she remains stable.  I have just started her on a Percocet because of complaints of pain when she is coughing. Consultants:  None Procedures none Antimicrobials: Rocephin and azithromycin Subjective: Feeling better still has throat pain  Objective: Vitals:   10/01/17 2130 10/01/17 2200 10/01/17 2310 10/02/17 0644  BP: 129/86 130/83 124/75 114/71  Pulse: (!) 123 (!) 122 (!) 129 (!) 121  Resp: (!) 25 (!) 21 (!) 22 20  Temp:   99.1 F (37.3 C) 98.9 F (37.2 C)  TempSrc:   Oral Oral  SpO2: 95% 96% 96% 96%  Weight:      Height:        Intake/Output Summary (Last 24 hours) at 10/02/2017 1524 Last data filed at 10/02/2017 0645 Gross per 24 hour  Intake 2050 ml  Output 300 ml  Net 1750 ml   Filed Weights   10/01/17 1326  Weight: (!) 165.6 kg (365 lb)    Examination:  General exam: Appears calm and comfortable  Respiratory system: Clear to auscultation. Respiratory effort normal. Cardiovascular system: S1 & S2 heard, RRR. No JVD, murmurs, rubs, gallops or clicks. No pedal edema. Gastrointestinal system: Abdomen is nondistended, soft and nontender. No organomegaly or masses felt. Normal bowel sounds  heard. Central nervous system: Alert and oriented. No focal neurological deficits. Extremities: Symmetric 5 x 5 power. Skin: No rashes, lesions or ulcers Psychiatry: Judgement and insight appear normal. Mood & affect appropriate.     Data Reviewed: I have personally reviewed following labs and imaging studies  CBC: Recent Labs  Lab 10/01/17 1524 10/02/17 0802  WBC 19.3* 17.7*  NEUTROABS 16.7*  --   HGB 14.2 11.9*  HCT 39.4 34.2*  MCV 83.1 85.5  PLT 177 811   Basic Metabolic Panel: Recent Labs    Lab 10/01/17 1524 10/02/17 0802  NA 134* 135  K 3.4* 3.4*  CL 102 105  CO2 22 23  GLUCOSE 228* 243*  BUN 10 12  CREATININE 0.91 1.00  CALCIUM 8.9 8.6*   GFR: Estimated Creatinine Clearance: 112 mL/min (by C-G formula based on SCr of 1 mg/dL). Liver Function Tests: Recent Labs  Lab 10/01/17 1524 10/02/17 0802  AST 21 20  ALT 18 17  ALKPHOS 162* 141*  BILITOT 1.9* 1.3*  PROT 7.5 6.9  ALBUMIN 3.1* 2.8*   No results for input(s): LIPASE, AMYLASE in the last 168 hours. No results for input(s): AMMONIA in the last 168 hours. Coagulation Profile: No results for input(s): INR, PROTIME in the last 168 hours. Cardiac Enzymes: Recent Labs  Lab 10/01/17 1525 10/02/17 0008 10/02/17 0802 10/02/17 1212  TROPONINI 0.04* <0.03 <0.03 <0.03   BNP (last 3 results) No results for input(s): PROBNP in the last 8760 hours. HbA1C: No results for input(s): HGBA1C in the last 72 hours. CBG: Recent Labs  Lab 10/02/17 0027 10/02/17 0740 10/02/17 1146  GLUCAP 238* 258* 209*   Lipid Profile: No results for input(s): CHOL, HDL, LDLCALC, TRIG, CHOLHDL, LDLDIRECT in the last 72 hours. Thyroid Function Tests: No results for input(s): TSH, T4TOTAL, FREET4, T3FREE, THYROIDAB in the last 72 hours. Anemia Panel: No results for input(s): VITAMINB12, FOLATE, FERRITIN, TIBC, IRON, RETICCTPCT in the last 72 hours. Sepsis Labs: Recent Labs  Lab 10/01/17 1537 10/01/17 1731  LATICACIDVEN 2.42* 2.00*    Recent Results (from the past 240 hour(s))  Blood Culture (routine x 2)     Status: None (Preliminary result)   Collection Time: 10/01/17  3:40 PM  Result Value Ref Range Status   Specimen Description BLOOD RIGHT HAND  Final   Special Requests   Final    BOTTLES DRAWN AEROBIC AND ANAEROBIC Blood Culture adequate volume   Culture   Final    NO GROWTH < 24 HOURS Performed at [facility], [place] [address], [place], [place] [address]    Report Status PENDING  Incomplete  Respiratory  Panel by PCR     Status: None   Collection Time: 10/02/17 12:42 AM  Result Value Ref Range Status   Adenovirus NOT DETECTED NOT DETECTED Final   Coronavirus 229E NOT DETECTED NOT DETECTED Final   Coronavirus HKU1 NOT DETECTED NOT DETECTED Final   Coronavirus NL63 NOT DETECTED NOT DETECTED Final   Coronavirus OC43 NOT DETECTED NOT DETECTED Final   Metapneumovirus NOT DETECTED NOT DETECTED Final   Rhinovirus / Enterovirus NOT DETECTED NOT DETECTED Final   Influenza A NOT DETECTED NOT DETECTED Final   Influenza B NOT DETECTED NOT DETECTED Final   Parainfluenza Virus 1 NOT DETECTED NOT DETECTED Final   Parainfluenza Virus 2 NOT DETECTED NOT DETECTED Final   Parainfluenza Virus 3 NOT DETECTED NOT DETECTED Final   Parainfluenza Virus 4 NOT DETECTED NOT DETECTED Final   Respiratory Syncytial Virus  NOT DETECTED NOT DETECTED Final   Bordetella pertussis NOT DETECTED NOT DETECTED Final   Chlamydophila pneumoniae NOT DETECTED NOT DETECTED Final   Mycoplasma pneumoniae NOT DETECTED NOT DETECTED Final         Radiology Studies: Dg Chest 2 View  Result Date: 10/01/2017 CLINICAL DATA:  Cough, fever, shortness of breath, and chest congestion for the past week. EXAM: CHEST  2 VIEW COMPARISON:  Portable chest x-ray of June 23, 2008 FINDINGS: There is dense infiltrate in the anterior aspect of the left lower lobe and in the lingula. The right lung is clear. The heart and pulmonary vascularity are normal. The mediastinum is normal in width. There is mild multilevel degenerative disc disease of the thoracic spine. IMPRESSION: Lingular and left lower lobe pneumonia. Followup PA and lateral chest X-ray is recommended in 3-4 weeks following trial of antibiotic therapy to ensure resolution and exclude underlying malignancy. Electronically Signed   By: David  Martinique M.D.   On: 10/01/2017 14:31   Ct Angio Chest Pe W/cm &/or Wo Cm  Result Date: 10/01/2017 CLINICAL DATA:  42 year old female with cough for 2  weeks. Fever, shortness of breath and tachycardia. Abnormal D-dimer. EXAM: CT ANGIOGRAPHY CHEST WITH CONTRAST TECHNIQUE: Multidetector CT imaging of the chest was performed using the standard protocol during bolus administration of intravenous contrast. Multiplanar CT image reconstructions and MIPs were obtained to evaluate the vascular anatomy. CONTRAST:  84m ISOVUE-370 IOPAMIDOL (ISOVUE-370) INJECTION 76% COMPARISON:  Chest radiographs 1400 hours today. FINDINGS: Cardiovascular: Adequate contrast bolus timing in the pulmonary arterial tree. Mild respiratory motion, more pronounced in the left lung. No convincing pulmonary artery filling defect. Borderline to mild cardiomegaly. No pericardial effusion. Negative visible aorta. No calcified coronary artery atherosclerosis is evident. Mediastinum/Nodes: Mild asymmetric enlargement of left hilar lymph nodes. No mediastinal lymphadenopathy. Lungs/Pleura: Widespread lingula consolidation with superimposed multifocal peribronchial nodularity in the superior segment of the left lower lobe, and peripheral consolidation in the posterior basal segment of the lower lobe. Widespread peribronchial nodularity in the left upper lobe. Early consolidation involving the inferior aspect of the left upper lobe. No superimpose pleural effusion. Major airways are patent. No abnormal right lung opacity. Upper Abdomen: Negative visible liver, gallbladder, spleen, pancreas, adrenal glands, and bowel. Musculoskeletal: Age advanced degenerative endplate changes in the thoracic spine. No acute osseous abnormality identified. Review of the MIP images confirms the above findings. IMPRESSION: 1. Multi-lobar Left Lung Pneumonia, worst in the lingula. No associated left pleural effusion. Mild reactive appearing left hilar lymph nodes. 2. No pulmonary embolus identified. 3. Followup PA and lateral chest X-ray is recommended in 3-4 weeks following trial of antibiotic therapy to ensure resolution and  exclude underlying malignancy. Electronically Signed   By: HGenevie AnnM.D.   On: 10/01/2017 19:25        Scheduled Meds: . enoxaparin (LOVENOX) injection  80 mg Subcutaneous QHS  . insulin aspart  0-5 Units Subcutaneous QHS  . insulin aspart  0-9 Units Subcutaneous TID WC  . protein supplement shake  11 oz Oral BID BM   Continuous Infusions: . azithromycin    . cefTRIAXone (ROCEPHIN)  IV       LOS: 1 day       EGeorgette Shell MD [facility] If 7PM-7AM, please contact night-coverage [email or web address] Password [facility]1/04/2018, 3:24 PM

## 2017-10-02 NOTE — Progress Notes (Signed)
Inpatient Diabetes Program Recommendations  AACE/ADA: New Consensus Statement on Inpatient Glycemic Control (2015)  Target Ranges:  Prepandial:   less than 140 mg/dL      Peak postprandial:   less than 180 mg/dL (1-2 hours)      Critically ill patients:  140 - 180 mg/dL   Lab Results  Component Value Date   GLUCAP 258 (H) 10/02/2017    Review of Glycemic Control Results for Karen Gibson, Karen Gibson (MRN [id number]) as of 10/02/2017 10:30  Ref. Range 10/02/2017 00:27 10/02/2017 07:40  Glucose-Capillary Latest Ref Range: 65 - 99 mg/dL 238 (H) 258 (H)   Diabetes history: Type 2 DM Outpatient Diabetes medications: none Current orders for Inpatient glycemic control: Novolog 0-5 Units HS, Novolog 0-9 TID [facility]  Inpatient Diabetes Program Recommendations:    Noted patient has history of DM Type 2, but no follow up with primary or endocrinology. No noted A1C, would recommend lab result. Based on FBG of 258 mg/dL would also recommend Lantus 30 Units QHS.  Will plan to follow up on patient to establish endo, place order for case management for PCP, and needs for outpatient.  Thanks, Bronson Curb, MSN, RNC-OB Diabetes Coordinator [phone] (8a-5p)

## 2017-10-02 NOTE — Progress Notes (Signed)
Initial Nutrition Assessment  DOCUMENTATION CODES:   Morbid obesity  INTERVENTION:   Provide Premier Protein BID, each supplement provides 160kcal and 30g protein.   NUTRITION DIAGNOSIS:   Inadequate oral intake related to social / environmental circumstances(depression) as evidenced by per patient/family report.  GOAL:   Patient will meet greater than or equal to 90% of their needs  MONITOR:   PO intake, Supplement acceptance, Weight trends, Labs  REASON FOR ASSESSMENT:   Malnutrition Screening Tool    ASSESSMENT:   Pt with PMH significant PCOS, HTN, and DM. Presented to [facility] with worsening dyspnea. Admitted for community acquired pneumonia.    Spoke with pt at bedside. Reports having decreased PO intake since August 17, 2017 when her dad passed away. States she typically eats two meals per day but admits to going "days" without eating. Pt consumed fruit and cottage cheese this morning. Complains of a sore throat though it does not affect her swallowing. Pt willing to try Premier Protein this admission. Pt unsure of any recent wt loss. Does not know her UBW because she doesn't get on the scale. Clothes are looser than usual. Weight records are limited.   Medications reviewed and include: SSI, IV abx Labs reviewed: K 3.4 (L) CBG 243 (H) ALP 141 (H)   NUTRITION - FOCUSED PHYSICAL EXAM:  Nutrition-Focused physical exam completed. Findings are no fat depletion, no muscle depletion, and no edema.   Diet Order:  Diet heart healthy/carb modified Room service appropriate? Yes; Fluid consistency: Thin  EDUCATION NEEDS:   Education needs have been addressed  Skin:  Skin Assessment: Reviewed RN Assessment  Last BM:  10/01/17  Height:   Ht Readings from Last 1 Encounters:  10/01/17 5\' 1"  (1.549 m)    Weight:   Wt Readings from Last 1 Encounters:  10/01/17 (!) 365 lb (165.6 kg)    Ideal Body Weight:  47.7 kg  BMI:  Body mass index is 68.97  kg/m.  Estimated Nutritional Needs:   Kcal:  1600-1800 kcal/day  Protein:  70-80 g/day  Fluid:  >1.6 L/day    Mariana Single RD, LDN Clinical Nutrition Pager # - [phone]

## 2017-10-02 NOTE — Progress Notes (Signed)
*   Echocardiogram 2D Echocardiogram has been performed.  Darlina Sicilian M 10/02/2017, 9:47 AM

## 2017-10-03 DIAGNOSIS — R000 Tachycardia, unspecified: Secondary | ICD-10-CM

## 2017-10-03 DIAGNOSIS — J181 Lobar pneumonia, unspecified organism: Principal | ICD-10-CM

## 2017-10-03 DIAGNOSIS — D72829 Elevated white blood cell count, unspecified: Secondary | ICD-10-CM

## 2017-10-03 LAB — LEGIONELLA PNEUMOPHILA SEROGP 1 UR AG: L. pneumophila Serogp 1 Ur Ag: NEGATIVE

## 2017-10-03 LAB — GLUCOSE, CAPILLARY
GLUCOSE-CAPILLARY: 173 mg/dL — AB (ref 65–99)
Glucose-Capillary: 179 mg/dL — ABNORMAL HIGH (ref 65–99)
Glucose-Capillary: 166 mg/dL — ABNORMAL HIGH (ref 65–99)
Glucose-Capillary: 168 mg/dL — ABNORMAL HIGH (ref 65–99)

## 2017-10-03 LAB — CBC
HCT: 35.1 % — ABNORMAL LOW (ref 36.0–46.0)
Hemoglobin: 12.2 g/dL (ref 12.0–15.0)
MCH: 29.7 pg (ref 26.0–34.0)
MCHC: 34.8 g/dL (ref 30.0–36.0)
MCV: 85.4 fL (ref 78.0–100.0)
PLATELETS: 178 10*3/uL (ref 150–400)
RBC: 4.11 MIL/uL (ref 3.87–5.11)
RDW: 13.1 % (ref 11.5–15.5)
WBC: 12.8 10*3/uL — ABNORMAL HIGH (ref 4.0–10.5)

## 2017-10-03 LAB — BASIC METABOLIC PANEL
ANION GAP: 6 (ref 5–15)
BUN: 15 mg/dL (ref 6–20)
CALCIUM: 8.7 mg/dL — AB (ref 8.9–10.3)
CO2: 25 mmol/L (ref 22–32)
CREATININE: 0.91 mg/dL (ref 0.44–1.00)
Chloride: 104 mmol/L (ref 101–111)
GFR calc Af Amer: >60 mL/min (ref >60)
Glucose, Bld: 214 mg/dL — ABNORMAL HIGH (ref 65–99)
Potassium: 3.7 mmol/L (ref 3.5–5.1)
Sodium: 135 mmol/L (ref 135–145)

## 2017-10-03 LAB — TSH: TSH: 3.76 u[IU]/mL (ref 0.350–4.500)

## 2017-10-03 LAB — T4, FREE: FREE T4: 0.9 ng/dL (ref 0.61–1.12)

## 2017-10-03 MED ORDER — GUAIFENESIN ER 600 MG PO TB12
1200.0000 mg | ORAL_TABLET | Freq: Two times a day (BID) | ORAL | Status: DC
  Administered 2017-10-03 – 2017-10-04 (×3): 1200 mg via ORAL
  Filled 2017-10-03 (×3): qty 2

## 2017-10-03 MED ORDER — FUROSEMIDE 10 MG/ML IJ SOLN
40.0000 mg | Freq: Once | INTRAMUSCULAR | Status: AC
  Administered 2017-10-03: 40 mg via INTRAVENOUS
  Filled 2017-10-03: qty 4

## 2017-10-03 MED ORDER — CARVEDILOL 6.25 MG PO TABS: 6.2500 mg | ORAL_TABLET | Freq: Two times a day (BID) | ORAL | Status: DC

## 2017-10-03 MED ORDER — LEVALBUTEROL HCL 0.63 MG/3ML IN NEBU
0.6300 mg | INHALATION_SOLUTION | Freq: Four times a day (QID) | RESPIRATORY_TRACT | Status: DC
  Administered 2017-10-03: 0.63 mg via RESPIRATORY_TRACT
  Filled 2017-10-03: qty 3

## 2017-10-03 MED ORDER — LEVOFLOXACIN 750 MG PO TABS
750.0000 mg | ORAL_TABLET | Freq: Every day | ORAL | Status: DC
  Administered 2017-10-04: 750 mg via ORAL
  Filled 2017-10-03: qty 1

## 2017-10-03 MED ORDER — LEVALBUTEROL HCL 0.63 MG/3ML IN NEBU: 0.6300 mg | INHALATION_SOLUTION | RESPIRATORY_TRACT | Status: DC | PRN

## 2017-10-03 MED ORDER — POTASSIUM CHLORIDE CRYS ER 20 MEQ PO TBCR
40.0000 meq | EXTENDED_RELEASE_TABLET | Freq: Once | ORAL | Status: AC
  Administered 2017-10-03: 40 meq via ORAL
  Filled 2017-10-03: qty 2

## 2017-10-03 MED ORDER — CARVEDILOL 3.125 MG PO TABS
3.1250 mg | ORAL_TABLET | Freq: Once | ORAL | Status: AC
  Administered 2017-10-03: 3.125 mg via ORAL
  Filled 2017-10-03: qty 1

## 2017-10-03 MED ORDER — LIP MEDEX EX OINT
1.0000 "application " | TOPICAL_OINTMENT | CUTANEOUS | Status: DC | PRN
  Filled 2017-10-03: qty 7

## 2017-10-03 MED ORDER — LEVALBUTEROL HCL 0.63 MG/3ML IN NEBU
0.6300 mg | INHALATION_SOLUTION | Freq: Two times a day (BID) | RESPIRATORY_TRACT | Status: DC
  Administered 2017-10-03 – 2017-10-04 (×2): 0.63 mg via RESPIRATORY_TRACT
  Filled 2017-10-03 (×3): qty 3

## 2017-10-03 MED ORDER — HYDROCODONE-HOMATROPINE 5-1.5 MG/5ML PO SYRP
5.0000 mL | ORAL_SOLUTION | Freq: Four times a day (QID) | ORAL | Status: DC | PRN
  Administered 2017-10-03 – 2017-10-04 (×3): 5 mL via ORAL
  Filled 2017-10-03 (×3): qty 5

## 2017-10-03 MED ORDER — CARVEDILOL 3.125 MG PO TABS
3.1250 mg | ORAL_TABLET | Freq: Two times a day (BID) | ORAL | Status: DC
  Administered 2017-10-03: 3.125 mg via ORAL
  Filled 2017-10-03: qty 1

## 2017-10-03 MED ORDER — INSULIN GLARGINE 100 UNIT/ML ~~LOC~~ SOLN
8.0000 [IU] | Freq: Every day | SUBCUTANEOUS | Status: DC
  Administered 2017-10-03: 8 [IU] via SUBCUTANEOUS
  Filled 2017-10-03 (×2): qty 0.08

## 2017-10-03 NOTE — Progress Notes (Signed)
HR during ambulation =147  resp rate 24-26  Ambulated 150 feet  In chair now

## 2017-10-03 NOTE — Progress Notes (Signed)
PROGRESS NOTE    Karen Gibson  MIW:[id number] DOB: 1976/07/19 DOA: 10/01/2017 PCP: Patient, No Pcp Per   Brief Narrative:  42 y.o.female,w PCOS, Hypertension, Dm2 apparently c/ocough w yellow sputum for the past 2 weeks. Woke up this am with increase in dyspnea and fever. Pt denies cp, palp, n/v, diarrhea, brbpr. Pt notes she has been in contact with her family who have had pneumonia and bronchitis. Pt presented to [facility] due to dyspnea.  CT angiogram chest as well as chest x-ray done concerning for multifocal pneumonia.  Negative for pulmonary emboli.     Assessment & Plan:   Principal Problem:   CAP (community acquired pneumonia) Active Problems:   Tachycardia   Elevated troponin   Hypokalemia   Diabetes mellitus without complication (HCC)   Leukocytosis  #1 community-acquired pneumonia multilobar pneumonia /Patient presenting with upper respiratory symptoms of a productive cough of yellowish sputum times 2 weeks, worsening dyspnea on exertion, fever.  Chest x-ray done and CT angiogram of the chest done concerning for multilobar pneumonia and negative for pulmonary emboli.  Patient was placed empirically on IV antibiotics.  Will add scheduled Xopenex nebulizers and Mucinex.  Patient improving clinically.  Patient noted to be significantly tachycardic on exertion today.  Hypoxia improving slowly.  Will transition to oral Levaquin tomorrow.  Follow.  2.  Hypokalemia Replete.  3.  Tachycardia Questionable etiology.  Likely secondary to problem #1.  Check a TSH and a free T4.  CT angiogram chest was negative for pulmonary emboli.  Will place patient on Coreg 6.25 mg p.o. twice daily.  Patient will need outpatient follow-up.  4.  Diabetes mellitus II CBGs have ranged from 214-243.  Check a hemoglobin A1c.  Will place on low-dose Lantus.  Sliding scale insulin.  Outpatient follow-up.  5.  Leukocytosis Secondary to problem #1.   leukocytosis trending down.   Continue empiric antibiotics.  Outpatient follow-up.   DVT prophylaxis: (Lovenox/Heparin/SCD's/anticoagulated/None (if comfort care) Code Status: (Full/Partial - specify details) Family Communication: (Specify name, relationship & date discussed. NO "discussed with patient") Disposition Plan: (specify when and where you expect patient to be discharged). Include barriers to DC in this tab.   Consultants:   None  Procedures:   CT angiogram chest 10/01/2017  Chest x-ray 10/01/2017  2D echo 10/12/2017  Antimicrobials:   IV Rocephin 10/01/2017>>>> 10/03/2017  IV azithromycin 10/01/2017>>>>> 10/03/2017  Oral Levaquin 10/04/2017   Subjective: Since sitting up in chair.  Patient states breathing has improved since admission.  Cough has improved.  No chest pain.  Patient noted to have heart rate of 147 while ambulating in the hallway.  Patient asking when she is able to go home.  Objective: Vitals:   10/01/17 2310 10/02/17 0644 10/02/17 2110 10/03/17 0620  BP: 124/75 114/71 113/75 124/76  Pulse: (!) 129 (!) 121 (!) 110 (!) 113  Resp: (!) 22 20 18 18   Temp: 99.1 F (37.3 C) 98.9 F (37.2 C) 99 F (37.2 C) 99.4 F (37.4 C)  TempSrc: Oral Oral Oral Oral  SpO2: 96% 96% 95% 92%  Weight:      Height:        Intake/Output Summary (Last 24 hours) at 10/03/2017 1258 Last data filed at 10/03/2017 1100 Gross per 24 hour  Intake 1060 ml  Output -  Net 1060 ml   Filed Weights   10/01/17 1326  Weight: (!) 165.6 kg (365 lb)    Examination:  General exam: Appears calm and comfortable  Respiratory system: Some coarse breath sounds noted.  Minimal wheezing.  No crackles. Respiratory effort normal. Cardiovascular system: S1 & S2 heard, tachycardia. No JVD, murmurs, rubs, gallops or clicks. No pedal edema. Gastrointestinal system: Abdomen is nondistended, soft and nontender. No organomegaly or masses felt. Normal bowel sounds heard. Central nervous system: Alert and oriented. No focal  neurological deficits. Extremities: Symmetric 5 x 5 power. Skin: No rashes, lesions or ulcers Psychiatry: Judgement and insight appear normal. Mood & affect appropriate.     Data Reviewed: I have personally reviewed following labs and imaging studies  CBC: Recent Labs  Lab 10/01/17 1524 10/02/17 0802 10/03/17 0440  WBC 19.3* 17.7* 12.8*  NEUTROABS 16.7*  --   --   HGB 14.2 11.9* 12.2  HCT 39.4 34.2* 35.1*  MCV 83.1 85.5 85.4  PLT 177 172 650   Basic Metabolic Panel: Recent Labs  Lab 10/01/17 1524 10/02/17 0802 10/03/17 0440  NA 134* 135 135  K 3.4* 3.4* 3.7  CL 102 105 104  CO2 22 23 25   GLUCOSE 228* 243* 214*  BUN 10 12 15   CREATININE 0.91 1.00 0.91  CALCIUM 8.9 8.6* 8.7*   GFR: Estimated Creatinine Clearance: 123.1 mL/min (by C-G formula based on SCr of 0.91 mg/dL). Liver Function Tests: Recent Labs  Lab 10/01/17 1524 10/02/17 0802  AST 21 20  ALT 18 17  ALKPHOS 162* 141*  BILITOT 1.9* 1.3*  PROT 7.5 6.9  ALBUMIN 3.1* 2.8*   No results for input(s): LIPASE, AMYLASE in the last 168 hours. No results for input(s): AMMONIA in the last 168 hours. Coagulation Profile: No results for input(s): INR, PROTIME in the last 168 hours. Cardiac Enzymes: Recent Labs  Lab 10/01/17 1525 10/02/17 0008 10/02/17 0802 10/02/17 1212  TROPONINI 0.04* <0.03 <0.03 <0.03   BNP (last 3 results) No results for input(s): PROBNP in the last 8760 hours. HbA1C: No results for input(s): HGBA1C in the last 72 hours. CBG: Recent Labs  Lab 10/02/17 1146 10/02/17 1650 10/02/17 2112 10/03/17 0812 10/03/17 1128  GLUCAP 209* 250* 198* 179* 166*   Lipid Profile: No results for input(s): CHOL, HDL, LDLCALC, TRIG, CHOLHDL, LDLDIRECT in the last 72 hours. Thyroid Function Tests: No results for input(s): TSH, T4TOTAL, FREET4, T3FREE, THYROIDAB in the last 72 hours. Anemia Panel: No results for input(s): VITAMINB12, FOLATE, FERRITIN, TIBC, IRON, RETICCTPCT in the last 72  hours. Sepsis Labs: Recent Labs  Lab 10/01/17 1537 10/01/17 1731  LATICACIDVEN 2.42* 2.00*    Recent Results (from the past 240 hour(s))  Blood Culture (routine x 2)     Status: None (Preliminary result)   Collection Time: 10/01/17  3:40 PM  Result Value Ref Range Status   Specimen Description BLOOD RIGHT HAND  Final   Special Requests   Final    BOTTLES DRAWN AEROBIC AND ANAEROBIC Blood Culture adequate volume   Culture   Final    NO GROWTH < 24 HOURS Performed at [facility], [place] [address] [place], [place] [address]    Report Status PENDING  Incomplete  Respiratory Panel by PCR     Status: None   Collection Time: 10/02/17 12:42 AM  Result Value Ref Range Status   Adenovirus NOT DETECTED NOT DETECTED Final   Coronavirus 229E NOT DETECTED NOT DETECTED Final   Coronavirus HKU1 NOT DETECTED NOT DETECTED Final   Coronavirus NL63 NOT DETECTED NOT DETECTED Final   Coronavirus OC43 NOT DETECTED NOT DETECTED Final   Metapneumovirus NOT DETECTED NOT DETECTED  Final   Rhinovirus / Enterovirus NOT DETECTED NOT DETECTED Final   Influenza A NOT DETECTED NOT DETECTED Final   Influenza B NOT DETECTED NOT DETECTED Final   Parainfluenza Virus 1 NOT DETECTED NOT DETECTED Final   Parainfluenza Virus 2 NOT DETECTED NOT DETECTED Final   Parainfluenza Virus 3 NOT DETECTED NOT DETECTED Final   Parainfluenza Virus 4 NOT DETECTED NOT DETECTED Final   Respiratory Syncytial Virus NOT DETECTED NOT DETECTED Final   Bordetella pertussis NOT DETECTED NOT DETECTED Final   Chlamydophila pneumoniae NOT DETECTED NOT DETECTED Final   Mycoplasma pneumoniae NOT DETECTED NOT DETECTED Final  Culture, sputum-assessment     Status: None   Collection Time: 10/02/17  5:48 PM  Result Value Ref Range Status   Specimen Description SPUTUM EXPECTORATED  Final   Special Requests Normal  Final   Sputum evaluation THIS SPECIMEN IS ACCEPTABLE FOR SPUTUM CULTURE  Final   Report Status 10/02/2017 FINAL  Final   Culture, respiratory (NON-Expectorated)     Status: None (Preliminary result)   Collection Time: 10/02/17  5:48 PM  Result Value Ref Range Status   Specimen Description SPUTUM EXPECTORATED  Final   Special Requests Normal Reflexed from [id number]  Final   Gram Stain   Final    FEW WBC PRESENT,BOTH PMN AND MONONUCLEAR RARE GRAM POSITIVE RODS RARE GRAM NEGATIVE RODS FEW GRAM POSITIVE COCCI Performed at [facility], [place] [address] [place], [place] [address]    Culture PENDING  Incomplete   Report Status PENDING  Incomplete         Radiology Studies: Dg Chest 2 View  Result Date: 10/01/2017 CLINICAL DATA:  Cough, fever, shortness of breath, and chest congestion for the past week. EXAM: CHEST  2 VIEW COMPARISON:  Portable chest x-ray of June 23, 2008 FINDINGS: There is dense infiltrate in the anterior aspect of the left lower lobe and in the lingula. The right lung is clear. The heart and pulmonary vascularity are normal. The mediastinum is normal in width. There is mild multilevel degenerative disc disease of the thoracic spine. IMPRESSION: Lingular and left lower lobe pneumonia. Followup PA and lateral chest X-ray is recommended in 3-4 weeks following trial of antibiotic therapy to ensure resolution and exclude underlying malignancy. Electronically Signed   By: David  Martinique M.D.   On: 10/01/2017 14:31   Ct Angio Chest Pe W/cm &/or Wo Cm  Result Date: 10/01/2017 CLINICAL DATA:  42 year old female with cough for 2 weeks. Fever, shortness of breath and tachycardia. Abnormal D-dimer. EXAM: CT ANGIOGRAPHY CHEST WITH CONTRAST TECHNIQUE: Multidetector CT imaging of the chest was performed using the standard protocol during bolus administration of intravenous contrast. Multiplanar CT image reconstructions and MIPs were obtained to evaluate the vascular anatomy. CONTRAST:  67mL ISOVUE-370 IOPAMIDOL (ISOVUE-370) INJECTION 76% COMPARISON:  Chest radiographs 1400 hours today. FINDINGS:  Cardiovascular: Adequate contrast bolus timing in the pulmonary arterial tree. Mild respiratory motion, more pronounced in the left lung. No convincing pulmonary artery filling defect. Borderline to mild cardiomegaly. No pericardial effusion. Negative visible aorta. No calcified coronary artery atherosclerosis is evident. Mediastinum/Nodes: Mild asymmetric enlargement of left hilar lymph nodes. No mediastinal lymphadenopathy. Lungs/Pleura: Widespread lingula consolidation with superimposed multifocal peribronchial nodularity in the superior segment of the left lower lobe, and peripheral consolidation in the posterior basal segment of the lower lobe. Widespread peribronchial nodularity in the left upper lobe. Early consolidation involving the inferior aspect of the left upper lobe. No superimpose pleural effusion. Major airways are  patent. No abnormal right lung opacity. Upper Abdomen: Negative visible liver, gallbladder, spleen, pancreas, adrenal glands, and bowel. Musculoskeletal: Age advanced degenerative endplate changes in the thoracic spine. No acute osseous abnormality identified. Review of the MIP images confirms the above findings. IMPRESSION: 1. Multi-lobar Left Lung Pneumonia, worst in the lingula. No associated left pleural effusion. Mild reactive appearing left hilar lymph nodes. 2. No pulmonary embolus identified. 3. Followup PA and lateral chest X-ray is recommended in 3-4 weeks following trial of antibiotic therapy to ensure resolution and exclude underlying malignancy. Electronically Signed   By: Genevie Ann M.D.   On: 10/01/2017 19:25        Scheduled Meds: . carvedilol  3.125 mg Oral BID WC  . enoxaparin (LOVENOX) injection  80 mg Subcutaneous QHS  . guaiFENesin  1,200 mg Oral BID  . insulin aspart  0-5 Units Subcutaneous QHS  . insulin aspart  0-9 Units Subcutaneous TID WC  . levalbuterol  0.63 mg Nebulization BID  . protein supplement shake  11 oz Oral BID BM   Continuous  Infusions: . azithromycin Stopped (10/02/17 1748)  . cefTRIAXone (ROCEPHIN)  IV Stopped (10/02/17 1601)     LOS: 2 days    Time spent: 35 minutes    Irine Seal, MD [facility] Pager [phone]  If 7PM-7AM, please contact night-coverage [email or web address] Password [facility] 10/03/2017, 12:58 PM

## 2017-10-03 NOTE — Progress Notes (Signed)
SATURATION QUALIFICATIONS: (This note is used to comply with regulatory documentation for home oxygen)  Patient Saturations on Room Air at Rest = 90%  Patient Saturations on Room Air while Ambulating = 91%  Patient Saturations on 0 Liters of oxygen while Ambulating = %  Please briefly explain why patient needs home oxygen:

## 2017-10-04 LAB — CBC WITH DIFFERENTIAL/PLATELET
BASOS ABS: 0.1 10*3/uL (ref 0.0–0.1)
BASOS PCT: 1 %
Eosinophils Absolute: 0.2 10*3/uL (ref 0.0–0.7)
Eosinophils Relative: 2 %
HCT: 34.3 % — ABNORMAL LOW (ref 36.0–46.0)
HEMOGLOBIN: 11.9 g/dL — AB (ref 12.0–15.0)
Lymphocytes Relative: 24 %
Lymphs Abs: 2.4 10*3/uL (ref 0.7–4.0)
MCH: 29.8 pg (ref 26.0–34.0)
MCHC: 34.7 g/dL (ref 30.0–36.0)
MCV: 85.8 fL (ref 78.0–100.0)
MONO ABS: 0.8 10*3/uL (ref 0.1–1.0)
Monocytes Relative: 8 %
NEUTROS ABS: 6.5 10*3/uL (ref 1.7–7.7)
NEUTROS PCT: 65 %
Platelets: 173 10*3/uL (ref 150–400)
RBC: 4 MIL/uL (ref 3.87–5.11)
RDW: 12.9 % (ref 11.5–15.5)
WBC: 10.1 10*3/uL (ref 4.0–10.5)

## 2017-10-04 LAB — GLUCOSE, CAPILLARY
GLUCOSE-CAPILLARY: 143 mg/dL — AB (ref 65–99)
Glucose-Capillary: 204 mg/dL — ABNORMAL HIGH (ref 65–99)

## 2017-10-04 LAB — BASIC METABOLIC PANEL
ANION GAP: 9 (ref 5–15)
BUN: 12 mg/dL (ref 6–20)
CALCIUM: 8.6 mg/dL — AB (ref 8.9–10.3)
CO2: 27 mmol/L (ref 22–32)
Chloride: 97 mmol/L — ABNORMAL LOW (ref 101–111)
Creatinine, Ser: 0.9 mg/dL (ref 0.44–1.00)
GFR calc non Af Amer: >60 mL/min (ref >60)
Glucose, Bld: 162 mg/dL — ABNORMAL HIGH (ref 65–99)
POTASSIUM: 3.3 mmol/L — AB (ref 3.5–5.1)
Sodium: 133 mmol/L — ABNORMAL LOW (ref 135–145)

## 2017-10-04 LAB — HEMOGLOBIN A1C
HEMOGLOBIN A1C: 6.3 % — AB (ref 4.8–5.6)
MEAN PLASMA GLUCOSE: 134.11 mg/dL

## 2017-10-04 LAB — MAGNESIUM: Magnesium: 1.4 mg/dL — ABNORMAL LOW (ref 1.7–2.4)

## 2017-10-04 MED ORDER — POTASSIUM CHLORIDE CRYS ER 20 MEQ PO TBCR
40.0000 meq | EXTENDED_RELEASE_TABLET | ORAL | Status: AC
  Administered 2017-10-04 (×2): 40 meq via ORAL
  Filled 2017-10-04 (×2): qty 2

## 2017-10-04 MED ORDER — METFORMIN HCL 500 MG PO TABS
500.0000 mg | ORAL_TABLET | Freq: Two times a day (BID) | ORAL | Status: DC
  Administered 2017-10-04: 500 mg via ORAL
  Filled 2017-10-04: qty 1

## 2017-10-04 MED ORDER — AMOXICILLIN-POT CLAVULANATE 875-125 MG PO TABS: 1.0000 | ORAL_TABLET | Freq: Two times a day (BID) | ORAL | 0 refills | Status: DC

## 2017-10-04 MED ORDER — METFORMIN HCL 500 MG PO TABS: 500.0000 mg | ORAL_TABLET | Freq: Two times a day (BID) | ORAL | 1 refills | Status: DC

## 2017-10-04 MED ORDER — GUAIFENESIN ER 600 MG PO TB12: 1200.0000 mg | ORAL_TABLET | Freq: Two times a day (BID) | ORAL | 0 refills | Status: AC

## 2017-10-04 MED ORDER — CARVEDILOL 6.25 MG PO TABS: 6.2500 mg | ORAL_TABLET | Freq: Two times a day (BID) | ORAL | 1 refills | Status: DC

## 2017-10-04 MED ORDER — HYDROCODONE-HOMATROPINE 5-1.5 MG/5ML PO SYRP: 5.0000 mL | ORAL_SOLUTION | Freq: Four times a day (QID) | ORAL | 0 refills | Status: DC | PRN

## 2017-10-04 MED ORDER — AMOXICILLIN-POT CLAVULANATE 875-125 MG PO TABS: 1.0000 | ORAL_TABLET | Freq: Two times a day (BID) | ORAL | 0 refills | Status: AC

## 2017-10-04 MED ORDER — MAGNESIUM SULFATE 4 GM/100ML IV SOLN
4.0000 g | Freq: Once | INTRAVENOUS | Status: AC
  Administered 2017-10-04: 4 g via INTRAVENOUS
  Filled 2017-10-04: qty 100

## 2017-10-04 MED ORDER — AMOXICILLIN-POT CLAVULANATE 875-125 MG PO TABS: 1.0000 | ORAL_TABLET | Freq: Two times a day (BID) | ORAL | Status: DC

## 2017-10-04 MED ORDER — CARVEDILOL 12.5 MG PO TABS
12.5000 mg | ORAL_TABLET | Freq: Two times a day (BID) | ORAL | Status: DC
  Administered 2017-10-04 (×2): 12.5 mg via ORAL
  Filled 2017-10-04 (×2): qty 1

## 2017-10-04 MED ORDER — GUAIFENESIN ER 600 MG PO TB12: 1200.0000 mg | ORAL_TABLET | Freq: Two times a day (BID) | ORAL | 0 refills | Status: DC

## 2017-10-04 MED ORDER — FUROSEMIDE 10 MG/ML IJ SOLN
20.0000 mg | Freq: Once | INTRAMUSCULAR | Status: AC
  Administered 2017-10-04: 20 mg via INTRAVENOUS
  Filled 2017-10-04: qty 2

## 2017-10-04 NOTE — Progress Notes (Signed)
Appointment at Patient Karen Gibson 10/12/17 at 1300. Pt is aware.

## 2017-10-04 NOTE — Progress Notes (Signed)
Reviewed discharge information with patient and caregiver. Answered all questions. Patient and caregiver able to teach back medications and reasons to contact MD or 911. Patient verbalizes importance of PCP follow up appointment. 

## 2017-10-04 NOTE — Discharge Summary (Addendum)
Physician Discharge Summary  Karen Gibson QIO:[id number] DOB: 1975/11/16 DOA: 10/01/2017  PCP: Patient, No Pcp Per  Admit date: 10/01/2017 Discharge date: 10/04/2017  Time spent: 65 minutes  Recommendations for Outpatient Follow-up:  1. Follow-up with PCP in 2 weeks.  On follow-up patient's tachycardia need to be reassessed and further decisions on whether to up titrate her beta-blocker can be done at that time.  Patient will need a basic metabolic profile done to follow-up on electrolytes and renal function.  Patient will need repeat chest x-ray done in about 3-4 weeks to follow-up on multilobar pneumonia.  Patient's diabetes will need to be reassessed.   Discharge Diagnoses:  Principal Problem:   CAP (community acquired pneumonia) Active Problems:   Tachycardia   Elevated troponin   Hypokalemia   Diabetes mellitus without complication (HCC)   Leukocytosis   Discharge Condition: Stable and improved.  Diet recommendation: Carb modified.  Filed Weights   10/01/17 1326 10/04/17 0638  Weight: (!) 165.6 kg (365 lb) (!) 166.5 kg (367 lb 1.1 oz)    History of present illness:  Per Dr. Janine Ores  is a 42 y.o. female, w PCOS, Hypertension, Dm2 apparently c/o cough w yellow sputum for the past 2 weeks.  Woke up the am of admission, with increase in dyspnea and fever.  Pt denied cp, palp, n/v, diarrhea, brbpr.  Pt noted she had been in contact with her family who have had pneumonia and bronchitis.  Pt presented to [facility] due to dyspnea.    In ED,  CTA chest IMPRESSION: 1. Multi-lobar Left Lung Pneumonia, worst in the lingula. No associated left pleural effusion. Mild reactive appearing left hilar lymph nodes. 2. No pulmonary embolus identified. 3. Followup PA and lateral chest X-ray is recommended in 3-4 weeks following trial of antibiotic therapy to ensure resolution and exclude underlying malignancy.  Influenza PCR which was done was negative.  D-dimer was  elevated at 1.35 and CT angiogram of chest which was consistent with multilobar pneumonia and negative for PE.    Hospital Course:  #1 community-acquired pneumonia multilobar pneumonia Patient presented with upper respiratory symptoms of a productive cough of yellowish sputum times 2 weeks, worsening dyspnea on exertion, fever.  Chest x-ray done and CT angiogram of the chest done concerning for multilobar pneumonia and negative for pulmonary emboli.  Influenza PCR was negative. Respiratory viral panel was negative. Patient was placed empirically on IV antibiotics of Rocephin and azithromycin.  Patient was also was placed on scheduled nebulizers and Mucinex.  Patient's hypoxia improved.  Patient improved clinically.  Patient was subsequently transitioned to oral Augmentin will be discharged on 5 more days of oral Augmentin to complete a one-week course of antibiotic treatment.  Patient will be discharged in stable and improved condition.  2.  Hypokalemia Repleted.  Outpatient follow-up.  3.  Tachycardia Questionable etiology.  Likely secondary to problem #1.  TSH and free T4 obtained were within normal limits.  D-dimer was noted to be elevated on admission and as such CT angiogram of chest was obtained. CT angiogram chest was negative for pulmonary emboli.  Patient was started on Coreg 6.25 mg twice daily for better rate control.  Outpatient follow-up with PCP.  4.  Diabetes mellitus II She is noted to have a history of type 2 diabetes.  Hemoglobin A1c obtained was 6.3.  Patient states was supposed to be on diabetic medications however has not had any in the month.  Patient was placed on  sliding scale insulin as well as low-dose Lantus during the hospitalization for blood glucose control.  Patient be discharged home on metformin 500 mg twice daily will need to follow-up with PCP for further management of her type 2 diabetes.    5.  Leukocytosis Secondary to problem #1.   leukocytosis trended  down on empiric antibiotics white count was down to 10.1 by day of discharge.  Outpatient follow-up.     Procedures:  CT angiogram chest 10/01/2017  Chest x-ray 10/01/2017  2D echo 10/12/2017      Consultations:  None  Discharge Exam: Vitals:   10/04/17 0929 10/04/17 1525  BP: 133/78 101/62  Pulse: (!) 103 94  Resp:  20  Temp: 99.1 F (37.3 C) 98.2 F (36.8 C)  SpO2: 91% 93%    General: NAD Cardiovascular: RRR Respiratory: CTAB  Discharge Instructions   Discharge Instructions    Diet Carb Modified   Complete by:  As directed    Increase activity slowly   Complete by:  As directed      Allergies as of 10/04/2017      Reactions   Bee Venom Other (See Comments)   Shortness of breath/throat swells.      Medication List    TAKE these medications   amoxicillin-clavulanate 875-125 MG tablet Commonly known as:  AUGMENTIN Take 1 tablet by mouth every 12 (twelve) hours for 5 days. Start taking on:  10/05/2017   carvedilol 6.25 MG tablet Commonly known as:  COREG Take 1 tablet (6.25 mg total) by mouth 2 (two) times daily with a meal.   guaiFENesin 600 MG 12 hr tablet Commonly known as:  MUCINEX Take 2 tablets (1,200 mg total) by mouth 2 (two) times daily for 5 days.   HYDROcodone-homatropine 5-1.5 MG/5ML syrup Commonly known as:  HYCODAN Take 5 mLs by mouth every 6 (six) hours as needed for cough.   ibuprofen 200 MG tablet Commonly known as:  ADVIL,MOTRIN Take 600 mg by mouth every 6 (six) hours as needed for moderate pain.   metFORMIN 500 MG tablet Commonly known as:  GLUCOPHAGE Take 1 tablet (500 mg total) by mouth 2 (two) times daily with a meal.      Allergies  Allergen Reactions  . Bee Venom Other (See Comments)    Shortness of breath/throat swells.   Follow-up Information    PCP. Schedule an appointment as soon as possible for a visit in 2 week(s).        [facility] Follow up on 10/12/2017.   Specialty:  Internal  Medicine Why:  Appointment 10/12/2017 at 1:00 PM. Please keep this appointment.  Contact information: [place] [phone]           The results of significant diagnostics from this hospitalization (including imaging, microbiology, ancillary and laboratory) are listed below for reference.    Significant Diagnostic Studies: Dg Chest 2 View  Result Date: 10/01/2017 CLINICAL DATA:  Cough, fever, shortness of breath, and chest congestion for the past week. EXAM: CHEST  2 VIEW COMPARISON:  Portable chest x-ray of June 23, 2008 FINDINGS: There is dense infiltrate in the anterior aspect of the left lower lobe and in the lingula. The right lung is clear. The heart and pulmonary vascularity are normal. The mediastinum is normal in width. There is mild multilevel degenerative disc disease of the thoracic spine. IMPRESSION: Lingular and left lower lobe pneumonia. Followup PA and lateral chest X-ray is recommended  in 3-4 weeks following trial of antibiotic therapy to ensure resolution and exclude underlying malignancy. Electronically Signed   By: David  Martinique M.D.   On: 10/01/2017 14:31   Ct Angio Chest Pe W/cm &/or Wo Cm  Result Date: 10/01/2017 CLINICAL DATA:  42 year old female with cough for 2 weeks. Fever, shortness of breath and tachycardia. Abnormal D-dimer. EXAM: CT ANGIOGRAPHY CHEST WITH CONTRAST TECHNIQUE: Multidetector CT imaging of the chest was performed using the standard protocol during bolus administration of intravenous contrast. Multiplanar CT image reconstructions and MIPs were obtained to evaluate the vascular anatomy. CONTRAST:  32mL ISOVUE-370 IOPAMIDOL (ISOVUE-370) INJECTION 76% COMPARISON:  Chest radiographs 1400 hours today. FINDINGS: Cardiovascular: Adequate contrast bolus timing in the pulmonary arterial tree. Mild respiratory motion, more pronounced in the left lung. No convincing pulmonary artery filling defect. Borderline to mild  cardiomegaly. No pericardial effusion. Negative visible aorta. No calcified coronary artery atherosclerosis is evident. Mediastinum/Nodes: Mild asymmetric enlargement of left hilar lymph nodes. No mediastinal lymphadenopathy. Lungs/Pleura: Widespread lingula consolidation with superimposed multifocal peribronchial nodularity in the superior segment of the left lower lobe, and peripheral consolidation in the posterior basal segment of the lower lobe. Widespread peribronchial nodularity in the left upper lobe. Early consolidation involving the inferior aspect of the left upper lobe. No superimpose pleural effusion. Major airways are patent. No abnormal right lung opacity. Upper Abdomen: Negative visible liver, gallbladder, spleen, pancreas, adrenal glands, and bowel. Musculoskeletal: Age advanced degenerative endplate changes in the thoracic spine. No acute osseous abnormality identified. Review of the MIP images confirms the above findings. IMPRESSION: 1. Multi-lobar Left Lung Pneumonia, worst in the lingula. No associated left pleural effusion. Mild reactive appearing left hilar lymph nodes. 2. No pulmonary embolus identified. 3. Followup PA and lateral chest X-ray is recommended in 3-4 weeks following trial of antibiotic therapy to ensure resolution and exclude underlying malignancy. Electronically Signed   By: Genevie Ann M.D.   On: 10/01/2017 19:25    Microbiology: Recent Results (from the past 240 hour(s))  Blood Culture (routine x 2)     Status: None (Preliminary result)   Collection Time: 10/01/17  3:40 PM  Result Value Ref Range Status   Specimen Description BLOOD RIGHT HAND  Final   Special Requests   Final    BOTTLES DRAWN AEROBIC AND ANAEROBIC Blood Culture adequate volume   Culture   Final    NO GROWTH 3 DAYS Performed at [facility], [address] [place], [place] [address]    Report Status PENDING  Incomplete  Culture, blood (routine x 2) Call MD if unable to obtain prior to  antibiotics being given     Status: None (Preliminary result)   Collection Time: 10/02/17 12:33 AM  Result Value Ref Range Status   Specimen Description BLOOD RIGHT ANTECUBITAL  Final   Special Requests IN PEDIATRIC BOTTLE Blood Culture adequate volume  Final   Culture   Final    NO GROWTH 2 DAYS Performed at [facility], [place] [address] [place],  [address]    Report Status PENDING  Incomplete  Culture, blood (routine x 2) Call MD if unable to obtain prior to antibiotics being given     Status: None (Preliminary result)   Collection Time: 10/02/17 12:33 AM  Result Value Ref Range Status   Specimen Description BLOOD LEFT HAND  Final   Special Requests   Final    BOTTLES DRAWN AEROBIC ONLY Blood Culture adequate volume   Culture   Final  NO GROWTH 2 DAYS Performed at [facility], [place] [address] [place], [place] [address]    Report Status PENDING  Incomplete  Respiratory Panel by PCR     Status: None   Collection Time: 10/02/17 12:42 AM  Result Value Ref Range Status   Adenovirus NOT DETECTED NOT DETECTED Final   Coronavirus 229E NOT DETECTED NOT DETECTED Final   Coronavirus HKU1 NOT DETECTED NOT DETECTED Final   Coronavirus NL63 NOT DETECTED NOT DETECTED Final   Coronavirus OC43 NOT DETECTED NOT DETECTED Final   Metapneumovirus NOT DETECTED NOT DETECTED Final   Rhinovirus / Enterovirus NOT DETECTED NOT DETECTED Final   Influenza A NOT DETECTED NOT DETECTED Final   Influenza B NOT DETECTED NOT DETECTED Final   Parainfluenza Virus 1 NOT DETECTED NOT DETECTED Final   Parainfluenza Virus 2 NOT DETECTED NOT DETECTED Final   Parainfluenza Virus 3 NOT DETECTED NOT DETECTED Final   Parainfluenza Virus 4 NOT DETECTED NOT DETECTED Final   Respiratory Syncytial Virus NOT DETECTED NOT DETECTED Final   Bordetella pertussis NOT DETECTED NOT DETECTED Final   Chlamydophila pneumoniae NOT DETECTED NOT DETECTED Final   Mycoplasma pneumoniae NOT DETECTED NOT DETECTED Final   Culture, sputum-assessment     Status: None   Collection Time: 10/02/17  5:48 PM  Result Value Ref Range Status   Specimen Description SPUTUM EXPECTORATED  Final   Special Requests Normal  Final   Sputum evaluation THIS SPECIMEN IS ACCEPTABLE FOR SPUTUM CULTURE  Final   Report Status 10/02/2017 FINAL  Final  Culture, respiratory (NON-Expectorated)     Status: None (Preliminary result)   Collection Time: 10/02/17  5:48 PM  Result Value Ref Range Status   Specimen Description SPUTUM EXPECTORATED  Final   Special Requests Normal Reflexed from [id number]  Final   Gram Stain   Final    FEW WBC PRESENT,BOTH PMN AND MONONUCLEAR RARE GRAM POSITIVE RODS RARE GRAM NEGATIVE RODS FEW GRAM POSITIVE COCCI    Culture   Final    CULTURE REINCUBATED FOR BETTER GROWTH Performed at [facility], [place]. [address], [place], [place] [address]    Report Status PENDING  Incomplete     Labs: Basic Metabolic Panel: Recent Labs  Lab 10/01/17 1524 10/02/17 0802 10/03/17 0440 10/04/17 0436  NA 134* 135 135 133*  K 3.4* 3.4* 3.7 3.3*  CL 102 105 104 97*  CO2 22 23 25 27   GLUCOSE 228* 243* 214* 162*  BUN 10 12 15 12   CREATININE 0.91 1.00 0.91 0.90  CALCIUM 8.9 8.6* 8.7* 8.6*  MG  --   --   --  1.4*   Liver Function Tests: Recent Labs  Lab 10/01/17 1524 10/02/17 0802  AST 21 20  ALT 18 17  ALKPHOS 162* 141*  BILITOT 1.9* 1.3*  PROT 7.5 6.9  ALBUMIN 3.1* 2.8*   No results for input(s): LIPASE, AMYLASE in the last 168 hours. No results for input(s): AMMONIA in the last 168 hours. CBC: Recent Labs  Lab 10/01/17 1524 10/02/17 0802 10/03/17 0440 10/04/17 0436  WBC 19.3* 17.7* 12.8* 10.1  NEUTROABS 16.7*  --   --  6.5  HGB 14.2 11.9* 12.2 11.9*  HCT 39.4 34.2* 35.1* 34.3*  MCV 83.1 85.5 85.4 85.8  PLT 177 172 178 173   Cardiac Enzymes: Recent Labs  Lab 10/01/17 1525 10/02/17 0008 10/02/17 0802 10/02/17 1212  TROPONINI 0.04* <0.03 <0.03 <0.03   BNP: BNP (last 3  results) No results for  input(s): BNP in the last 8760 hours.  ProBNP (last 3 results) No results for input(s): PROBNP in the last 8760 hours.  CBG: Recent Labs  Lab 10/03/17 1128 10/03/17 1712 10/03/17 2207 10/04/17 0747 10/04/17 1150  GLUCAP 166* 173* 168* 143* 204*       Signed:  Irine Seal MD.  [facility] 10/04/2017, 4:59 PM

## 2017-10-05 LAB — CULTURE, RESPIRATORY W GRAM STAIN
Culture: NORMAL
Special Requests: NORMAL

## 2017-10-05 LAB — CULTURE, RESPIRATORY

## 2017-10-06 LAB — CULTURE, BLOOD (ROUTINE X 2)
Culture: NO GROWTH
Culture: NO GROWTH
Special Requests: ADEQUATE
Special Requests: ADEQUATE

## 2017-10-07 LAB — CULTURE, BLOOD (ROUTINE X 2)
CULTURE: NO GROWTH
CULTURE: NO GROWTH
Special Requests: ADEQUATE
Special Requests: ADEQUATE

## 2017-10-12 ENCOUNTER — Ambulatory Visit: Payer: Self-pay | Admitting: Family Medicine

## 2018-01-13 ENCOUNTER — Other Ambulatory Visit: Payer: Self-pay | Admitting: Emergency Medicine

## 2018-01-13 ENCOUNTER — Emergency Department (HOSPITAL_BASED_OUTPATIENT_CLINIC_OR_DEPARTMENT_OTHER)
Admission: EM | Admit: 2018-01-13 | Discharge: 2018-01-13 | Disposition: A | Discharge status: Home / Self Care | Payer: Self-pay | Attending: Emergency Medicine | Admitting: Emergency Medicine

## 2018-01-13 ENCOUNTER — Emergency Department (HOSPITAL_BASED_OUTPATIENT_CLINIC_OR_DEPARTMENT_OTHER): Payer: Self-pay

## 2018-01-13 ENCOUNTER — Encounter (HOSPITAL_BASED_OUTPATIENT_CLINIC_OR_DEPARTMENT_OTHER): Payer: Self-pay | Admitting: Emergency Medicine

## 2018-01-13 ENCOUNTER — Other Ambulatory Visit: Payer: Self-pay

## 2018-01-13 DIAGNOSIS — R1031 Right lower quadrant pain: Secondary | ICD-10-CM | POA: Insufficient documentation

## 2018-01-13 DIAGNOSIS — E119 Type 2 diabetes mellitus without complications: Secondary | ICD-10-CM | POA: Insufficient documentation

## 2018-01-13 DIAGNOSIS — I10 Essential (primary) hypertension: Secondary | ICD-10-CM | POA: Insufficient documentation

## 2018-01-13 DIAGNOSIS — R319 Hematuria, unspecified: Secondary | ICD-10-CM | POA: Insufficient documentation

## 2018-01-13 DIAGNOSIS — R109 Unspecified abdominal pain: Secondary | ICD-10-CM

## 2018-01-13 DIAGNOSIS — Z7984 Long term (current) use of oral hypoglycemic drugs: Secondary | ICD-10-CM | POA: Insufficient documentation

## 2018-01-13 DIAGNOSIS — R748 Abnormal levels of other serum enzymes: Secondary | ICD-10-CM | POA: Insufficient documentation

## 2018-01-13 DIAGNOSIS — Z79899 Other long term (current) drug therapy: Secondary | ICD-10-CM | POA: Insufficient documentation

## 2018-01-13 LAB — CBC WITH DIFFERENTIAL/PLATELET
Basophils Absolute: 0 10*3/uL (ref 0.0–0.1)
Basophils Relative: 0 %
EOS ABS: 0.1 10*3/uL (ref 0.0–0.7)
EOS PCT: 1 %
HCT: 38.7 % (ref 36.0–46.0)
Hemoglobin: 14.3 g/dL (ref 12.0–15.0)
LYMPHS ABS: 1.8 10*3/uL (ref 0.7–4.0)
Lymphocytes Relative: 25 %
MCH: 29.8 pg (ref 26.0–34.0)
MCHC: 37 g/dL — ABNORMAL HIGH (ref 30.0–36.0)
MCV: 80.6 fL (ref 78.0–100.0)
MONO ABS: 0.5 10*3/uL (ref 0.1–1.0)
Monocytes Relative: 7 %
Neutro Abs: 4.7 10*3/uL (ref 1.7–7.7)
Neutrophils Relative %: 67 %
PLATELETS: 146 10*3/uL — AB (ref 150–400)
RBC: 4.8 MIL/uL (ref 3.87–5.11)
RDW: 12.9 % (ref 11.5–15.5)
WBC: 7 10*3/uL (ref 4.0–10.5)

## 2018-01-13 LAB — COMPREHENSIVE METABOLIC PANEL
ALT: 46 U/L (ref 14–54)
ANION GAP: 8 (ref 5–15)
AST: 56 U/L — ABNORMAL HIGH (ref 15–41)
Albumin: 3.4 g/dL — ABNORMAL LOW (ref 3.5–5.0)
Alkaline Phosphatase: 140 U/L — ABNORMAL HIGH (ref 38–126)
BUN: 8 mg/dL (ref 6–20)
CHLORIDE: 99 mmol/L — AB (ref 101–111)
CO2: 23 mmol/L (ref 22–32)
Calcium: 8.3 mg/dL — ABNORMAL LOW (ref 8.9–10.3)
Creatinine, Ser: 0.58 mg/dL (ref 0.44–1.00)
Glucose, Bld: 347 mg/dL — ABNORMAL HIGH (ref 65–99)
POTASSIUM: 4.3 mmol/L (ref 3.5–5.1)
SODIUM: 130 mmol/L — AB (ref 135–145)
Total Bilirubin: 1.8 mg/dL — ABNORMAL HIGH (ref 0.3–1.2)
Total Protein: 7.1 g/dL (ref 6.5–8.1)

## 2018-01-13 LAB — URINALYSIS, MICROSCOPIC (REFLEX)

## 2018-01-13 LAB — URINALYSIS, ROUTINE W REFLEX MICROSCOPIC
Bilirubin Urine: NEGATIVE
Ketones, ur: NEGATIVE mg/dL
Leukocytes, UA: NEGATIVE
Nitrite: NEGATIVE
PH: 6 (ref 5.0–8.0)
Protein, ur: NEGATIVE mg/dL

## 2018-01-13 LAB — PREGNANCY, URINE: PREG TEST UR: NEGATIVE

## 2018-01-13 LAB — CBG MONITORING, ED: GLUCOSE-CAPILLARY: 361 mg/dL — AB (ref 65–99)

## 2018-01-13 MED ORDER — CARVEDILOL 6.25 MG PO TABS: 6.2500 mg | ORAL_TABLET | Freq: Two times a day (BID) | ORAL | 0 refills | Status: DC

## 2018-01-13 MED ORDER — ACETAMINOPHEN 500 MG PO TABS
1000.0000 mg | ORAL_TABLET | Freq: Once | ORAL | Status: AC
  Administered 2018-01-13: 1000 mg via ORAL
  Filled 2018-01-13: qty 2

## 2018-01-13 MED ORDER — CYCLOBENZAPRINE HCL 10 MG PO TABS
10.0000 mg | ORAL_TABLET | Freq: Once | ORAL | Status: AC
Start: 2018-01-13 — End: 2018-01-13
  Administered 2018-01-13: 10 mg via ORAL
  Filled 2018-01-13: qty 1

## 2018-01-13 MED ORDER — METFORMIN HCL 500 MG PO TABS: 500.0000 mg | ORAL_TABLET | Freq: Two times a day (BID) | ORAL | 0 refills | Status: DC

## 2018-01-13 MED ORDER — CYCLOBENZAPRINE HCL 10 MG PO TABS: 10.0000 mg | ORAL_TABLET | Freq: Two times a day (BID) | ORAL | 0 refills | Status: DC | PRN

## 2018-01-13 MED ORDER — MORPHINE SULFATE (PF) 4 MG/ML IV SOLN
4.0000 mg | Freq: Once | INTRAVENOUS | Status: DC
  Filled 2018-01-13: qty 1

## 2018-01-13 MED ORDER — SODIUM CHLORIDE 0.9 % IV BOLUS
1000.0000 mL | Freq: Once | INTRAVENOUS | Status: AC
  Administered 2018-01-13: 1000 mL via INTRAVENOUS

## 2018-01-13 MED ORDER — IOPAMIDOL (ISOVUE-300) INJECTION 61%
100.0000 mL | Freq: Once | INTRAVENOUS | Status: AC | PRN
  Administered 2018-01-13: 100 mL via INTRAVENOUS

## 2018-01-13 NOTE — Discharge Instructions (Addendum)
Please see the information and instructions below regarding your visit.  Your diagnoses today include:  1. Elevated alkaline phosphatase level   2. Flank pain    About diagnosis. Most episodes of acute low back pain are self-limited. Your exam was reassuring today that the source of your pain is not affecting the spinal cord and nerves that originate in the spinal cord.   If you have a history of disc herniation or arthritis in your spine, the nerves exiting the spine on one side get inflamed. This can cause severe pain. We call this radiculopathy. We do not always know what causes the sudden inflammation.  Tests performed today include: See side panel of your discharge paperwork for testing performed today. Vital signs are listed at the bottom of these instructions.   See the attached paperwork on your lab work and imaging.  Couple of your liver enzymes are slightly elevated today.  Your sodium is slightly low, which is likely related to your high blood sugar.  You also have some blood in the urine.  This is unexplained by our imaging.  Please follow-up with primary care, and get a repeat urine to make sure this is not persistent.  Medications prescribed:    Take any prescribed medications only as prescribed, and any over the counter medications only as directed on the packaging.  You are prescribed Flexeril, a muscle relaxant. Some common side effects of this medication include:  Feeling sleepy.  Dizziness. Take care upon going from a seated to a standing position.  Dry mouth.  Feeling tired or weak.  Hard stools (constipation).  Upset stomach. These are not all of the side effects that may occur. If you have questions about side effects, call your doctor. Call your primary care provider for medical advice about side effects.  This medication can be sedating. Only take this medication as needed. Please do not combine with alcohol. Do not drive or operate machinery while taking this  medication.   This medication can interact with some other medications. Make sure to tell any provider you are taking this medication before they prescribe you a new medication.    Home care instructions:   Low back pain gets worse the longer you stay stationary. Please keep moving and walking as tolerated. There are exercises included in this packet to perform as tolerated for your low back pain.   Apply heat to the areas that are painful. Avoid twisting or bending your trunk to lift something. Do not lift anything above 25 lbs while recovering from this flare of low back pain.  Please follow any educational materials contained in this packet.   Follow-up instructions: Please follow-up with your primary care provider as soon as possible for further evaluation of your symptoms if they are not completely improved.   Return instructions:  Please return to the Emergency Department if you experience worsening symptoms.  Please return for any fever or chills in the setting of your back pain, weakness in the muscles of the legs, numbness in your legs and feet that is new or changing, numbness in the area where you wipe, retention of your urine, loss of bowel or bladder control, or problems with walking. Please return if you have any other emergent concerns.  Additional Information: I placed a case management consult for you to be able to return to primary care.  Please also call the clinics above to try to get in as soon as possible.  Your vital signs today were:  BP (!) 147/89 (BP Location: Left Arm)    Pulse (!) 112    Temp 99.2 F (37.3 C) (Oral)    Resp 20    Ht 5' (1.524 m)    Wt (!) 166.5 kg (367 lb)    SpO2 100%    BMI 71.67 kg/m  If your blood pressure (BP) was elevated on multiple readings during this visit above 130 for the top number or above 80 for the bottom number, please have this repeated by your primary care provider within one month. --------------  Thank you for allowing [place]  to participate in your care today.

## 2018-01-13 NOTE — ED Notes (Signed)
ED Provider at bedside. 

## 2018-01-13 NOTE — ED Notes (Signed)
Attempted IV access x 2 without success.

## 2018-01-13 NOTE — ED Provider Notes (Signed)
[facility] EMERGENCY DEPARTMENT Provider Note   CSN: [id number] Arrival date & time: 01/13/18  [place]     History   Chief Complaint Chief Complaint  Patient presents with  . Flank Pain    HPI Karen Gibson is a 42 y.o. female.  HPI  Patient is a 42 year old female with a history of type 2 diabetes mellitus, hypertension, PCO S presenting for right flank pain.  Patient reports her pain is most acute with movement.  Patient reports that she fell at couple days ago, but it subsided spontaneously but today she felt it suddenly without a particular precipitating event.  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  Patient denies any abdominal pain, dysuria, urgency, frequency, vaginal discharge, or vaginal bleeding when not have her period.  Patient reports she is sexually active with one female partner, denies concerns about STI, and reports that she is not sure when her last menstrual cycle was, as it is a regular.  Patient denies any lower extremity weakness, numbness, saddle anesthesia, loss of bowel or bladder control, history of cancer treatment, or recent spinal procedures.  Patient does report she has felt slightly warm today.  Patient denies remedies prior to arrival for symptoms.  Past Medical History:  Diagnosis Date  . Diabetes mellitus without complication ([place])   . Hypertension   . Polycystic ovarian syndrome     Patient Active Problem List   Diagnosis Date Noted  . CAP (community acquired pneumonia) 10/01/2017  . Tachycardia 10/01/2017  . Elevated troponin 10/01/2017  . Hypokalemia 10/01/2017  . Diabetes mellitus without complication ([place]) 40/81/4481  . Leukocytosis 10/01/2017    History reviewed. No pertinent surgical history.   OB History   None      Home Medications    Prior to Admission medications   Medication Sig Start Date End Date Taking?  Authorizing Provider  carvedilol (COREG) 6.25 MG tablet Take 1 tablet (6.25 mg total) by mouth 2 (two) times daily with a meal. 10/04/17   Eugenie Filler, MD  HYDROcodone-homatropine [facility]) 5-1.5 MG/5ML syrup Take 5 mLs by mouth every 6 (six) hours as needed for cough. 10/04/17   Eugenie Filler, MD  ibuprofen (ADVIL,MOTRIN) 200 MG tablet Take 600 mg by mouth every 6 (six) hours as needed for moderate pain.    [provider]  metFORMIN (GLUCOPHAGE) 500 MG tablet Take 1 tablet (500 mg total) by mouth 2 (two) times daily with a meal. 10/04/17   Eugenie Filler, MD    Family History Family History  Problem Relation Age of Onset  . Diabetes Mother   . Heart failure Father   . Cancer Father   . Stroke Father     Social History Social History   Tobacco Use  . Smoking status: Never Smoker  . Smokeless tobacco: Never Used  Substance Use Topics  . Alcohol use: Yes    Comment: occ  . Drug use: No     Allergies   Bee venom   Review of Systems Review of Systems  Constitutional: Negative for chills and fever.  HENT: Negative for congestion and rhinorrhea.   Eyes: Negative for visual disturbance.  Respiratory: Negative for cough, chest tightness and shortness of breath.   Cardiovascular: Negative for chest pain, palpitations and leg swelling.  Gastrointestinal: Negative for abdominal pain, diarrhea, nausea and vomiting.  Genitourinary: Positive for flank pain.  Negative for dysuria, frequency, urgency, vaginal bleeding and vaginal discharge.  Musculoskeletal: Positive for back pain. Negative for gait problem and myalgias.  Skin: Negative for rash.  Neurological: Negative for weakness and numbness.     Physical Exam Updated Vital Signs BP (!) 155/119 (BP Location: Left Arm)   Pulse (!) 107   Temp 98.4 F (36.9 C) (Oral)   Resp 18   Ht 5' (1.524 m)   Wt (!) 166.5 kg (367 lb)   SpO2 100%   BMI 71.67 kg/m   Physical Exam  Constitutional: She appears  well-developed and well-nourished. No distress.  Obese female resting comfortably in bed.   HENT:  Head: Normocephalic and atraumatic.  Mouth/Throat: Oropharynx is clear and moist.  Eyes: Pupils are equal, round, and reactive to light. Conjunctivae and EOM are normal.  Neck: Normal range of motion. Neck supple.  Cardiovascular: Normal rate, S1 normal and S2 normal.  No murmur heard. Tachycardia noted.   Pulmonary/Chest: Effort normal and breath sounds normal. She has no wheezes. She has no rales.  Abdominal: Soft. She exhibits no distension. There is no tenderness. There is no guarding.  Musculoskeletal: Normal range of motion. She exhibits no edema or deformity.  Spine Exam: Inspection/Palpation: Patient is point tender over right sacroiliac joint, as well is paraspinal musculature close to midline.  Patient has no midline tenderness of cervical, thoracic, or lumbar spine. Strength: 5/5 throughout LE bilaterally (hip flexion/extension, adduction/abduction; knee flexion/extension; foot dorsiflexion/plantarflexion, inversion/eversion; great toe inversion) Sensation: Intact to light touch in proximal and distal LE bilaterally   Lymphadenopathy:    She has no cervical adenopathy.  Neurological: She is alert.  Cranial nerves grossly intact. Patient moves extremities symmetrically and with good coordination.  Skin: Skin is warm and dry. No rash noted. No erythema.  Psychiatric: She has a normal mood and affect. Her behavior is normal. Judgment and thought content normal.  Nursing note and vitals reviewed.    ED Treatments / Results  Labs (all labs ordered are listed, but only abnormal results are displayed) Labs Reviewed  URINALYSIS, ROUTINE W REFLEX MICROSCOPIC - Abnormal; Notable for the following components:      Result Value   Specific Gravity, Urine <1.005 (*)    Glucose, UA >=500 (*)    Hgb urine dipstick MODERATE (*)    All other components within normal limits  CBC WITH  DIFFERENTIAL/PLATELET - Abnormal; Notable for the following components:   MCHC 37.0 (*)    Platelets 146 (*)    All other components within normal limits  COMPREHENSIVE METABOLIC PANEL - Abnormal; Notable for the following components:   Sodium 130 (*)    Chloride 99 (*)    Glucose, Bld 347 (*)    Calcium 8.3 (*)    Albumin 3.4 (*)    AST 56 (*)    Alkaline Phosphatase 140 (*)    Total Bilirubin 1.8 (*)    All other components within normal limits  CBG MONITORING, ED - Abnormal; Notable for the following components:   Glucose-Capillary 361 (*)    All other components within normal limits  PREGNANCY, URINE    EKG None  Radiology No results found.  Procedures Procedures (including critical care time)  Medications Ordered in ED Medications  morphine 4 MG/ML injection 4 mg (4 mg Intravenous Refused 01/13/18 1821)  sodium chloride 0.9 % bolus 1,000 mL (1,000 mLs Intravenous New Bag/Given 01/13/18 1826)  acetaminophen (TYLENOL) tablet 1,000 mg (1,000 mg Oral Given 01/13/18 1850)  iopamidol (  ISOVUE-300) 61 % injection 100 mL (100 mLs Intravenous Contrast Given 01/13/18 1901)     Initial Impression / Assessment and Plan / ED Course  I have reviewed the triage vital signs and the nursing notes.  Pertinent labs & imaging results that were available during my care of the patient were reviewed by me and considered in my medical decision making (see chart for details).  Clinical Course as of Jan 14 2151  Sun Jan 13, 2018  1858 Sodium noted.  This is likely factitious in the setting of hyperglycemia.   [AM]  2015 Patient reassessed.  Pulse slightly above 105. Significantly improved from initial reading.   [AM]    Clinical Course User Index [AM] Albesa Seen, PA-C    Patient is nontoxic-appearing, afebrile, and in no acute distress at rest.  Patient reporting the pain recurs when she moves.  Differential diagnosis includes lumbar echo, musculoskeletal strain, nephrolithiasis,  soft tissue infection, epidural abscess.  Patient is not having any symptoms suggestive of cauda equina to suggest that she has acute infection involving spinal cord.  Also doubt pelvic pathologies, as patient has no increased vaginal discharge, and is not pregnant as evidenced by POC urine.  Laboratory analysis demonstrates no leukocytosis.  Patient exhibits hyperglycemia with likely factitious hyponatremia at 130.  Potassium is normal.  Patient exhibits elevated alkaline phosphatase and slightly elevated AST.  This is occurred in the past based on CMP results.  Total bili is also elevated 1.8.  Patient demonstrates fatty infiltration of the liver, but has no right upper quadrant pain, jaundice, or other signs of infectious etiologies involving the liver.  Patient exhibits moderate hematuria, and I discussed with the patient following up with primary care regarding checking this to see if this needs to be worked up.  CT demonstrates large stool burden, but no evidence of soft tissue abscess or infection, acute organ pathology.  There is evidence of fatty infiltration of the liver.  CT lumbar spine demonstrates numerous degenerative changes, but no evidence of acute fracture account for patient's pain.  Given negative workup, and the associated symptom of pain only with movement, suspect musculoskeletal strain.  Discussed with patient performing light duty for 1 week, and treating with topical therapies as well as muscle relaxants.  Patient also has multiple metabolic conditions that require PCP follow-up and is without PCP at this time.  I placed case management consult to assist getting patient into the [facility] health clinics, and also gave patient these resources.  1 month supply of her chronic medications dispensed.  Patient given return precautions for any weakness or numbness in lower extremities, loss of bowel bladder control, saddle anesthesia, or new or worsening pain.  Patient understanding and agrees  with the plan of care.  This is a shared visit with Dr. Deno Etienne. Patient was independently evaluated by this attending physician. Attending physician consulted in evaluation and discharge management.  Final Clinical Impressions(s) / ED Diagnoses   Final diagnoses:  Flank pain  Elevated alkaline phosphatase level  Hematuria, unspecified type    ED Discharge Orders        Ordered    metFORMIN (GLUCOPHAGE) 500 MG tablet  2 times daily with meals     01/13/18 2024    carvedilol (COREG) 6.25 MG tablet  2 times daily with meals     01/13/18 2024    cyclobenzaprine (FLEXERIL) 10 MG tablet  2 times daily PRN     01/13/18 2033  Tamala Julian 01/13/18 2159    Deno Etienne, DO 01/13/18 2252

## 2018-01-13 NOTE — ED Notes (Signed)
CT waiting for Labs prior to performing CT with Contrast

## 2018-01-13 NOTE — ED Triage Notes (Signed)
Patient states that she is having right sided flank pain x 4 days - denies any N/V

## 2018-01-13 NOTE — ED Notes (Signed)
Pt given d/c instructions as per chart. Rx x 3 with precautions. Verbalizes understanding. No questions.

## 2018-01-14 NOTE — Care Management Note (Signed)
Case Management Note  CM consulted for no pcp and no ins with need for follow up and medication assistance.  CM noted pt was given an appointment to establish PCP care at the [facility] in Jan 2019 to which she did not go.  CM noted pt was given information for all 3 clinics at D/C.  CM will not make an appointment when appointments are limited and pt is non-compliant with follow up.  Pt can make her own appointment with information provided to her on the AVS.  No further CM needs noted at this time.

## 2018-03-16 ENCOUNTER — Emergency Department (HOSPITAL_BASED_OUTPATIENT_CLINIC_OR_DEPARTMENT_OTHER)
Admission: EM | Admit: 2018-03-16 | Discharge: 2018-03-16 | Disposition: A | Discharge status: Home / Self Care | Payer: Self-pay | Attending: Emergency Medicine | Admitting: Emergency Medicine

## 2018-03-16 ENCOUNTER — Encounter (HOSPITAL_BASED_OUTPATIENT_CLINIC_OR_DEPARTMENT_OTHER): Payer: Self-pay | Admitting: Emergency Medicine

## 2018-03-16 ENCOUNTER — Other Ambulatory Visit: Payer: Self-pay

## 2018-03-16 DIAGNOSIS — H9202 Otalgia, left ear: Secondary | ICD-10-CM | POA: Insufficient documentation

## 2018-03-16 DIAGNOSIS — E119 Type 2 diabetes mellitus without complications: Secondary | ICD-10-CM | POA: Insufficient documentation

## 2018-03-16 DIAGNOSIS — N390 Urinary tract infection, site not specified: Secondary | ICD-10-CM | POA: Insufficient documentation

## 2018-03-16 DIAGNOSIS — I10 Essential (primary) hypertension: Secondary | ICD-10-CM | POA: Insufficient documentation

## 2018-03-16 DIAGNOSIS — Z7984 Long term (current) use of oral hypoglycemic drugs: Secondary | ICD-10-CM | POA: Insufficient documentation

## 2018-03-16 DIAGNOSIS — Z79899 Other long term (current) drug therapy: Secondary | ICD-10-CM | POA: Insufficient documentation

## 2018-03-16 LAB — URINALYSIS, ROUTINE W REFLEX MICROSCOPIC
Bilirubin Urine: NEGATIVE
GLUCOSE, UA: 250 mg/dL — AB
KETONES UR: NEGATIVE mg/dL
NITRITE: POSITIVE — AB
Protein, ur: 30 mg/dL — AB
SPECIFIC GRAVITY, URINE: 1.025 (ref 1.005–1.030)
pH: 6 (ref 5.0–8.0)

## 2018-03-16 LAB — WET PREP, GENITAL
Clue Cells Wet Prep HPF POC: NONE SEEN
Sperm: NONE SEEN
Trich, Wet Prep: NONE SEEN

## 2018-03-16 LAB — URINALYSIS, MICROSCOPIC (REFLEX): WBC, UA: >50 WBC/hpf (ref 0–5)

## 2018-03-16 LAB — PREGNANCY, URINE: Preg Test, Ur: NEGATIVE

## 2018-03-16 MED ORDER — KETOROLAC TROMETHAMINE 30 MG/ML IJ SOLN
30.0000 mg | Freq: Once | INTRAMUSCULAR | Status: AC
  Administered 2018-03-16: 30 mg via INTRAMUSCULAR
  Filled 2018-03-16: qty 1

## 2018-03-16 MED ORDER — NAPROXEN 500 MG PO TABS: 500.0000 mg | ORAL_TABLET | Freq: Two times a day (BID) | ORAL | 0 refills | Status: DC

## 2018-03-16 MED ORDER — FLUCONAZOLE 100 MG PO TABS
150.0000 mg | ORAL_TABLET | Freq: Once | ORAL | Status: AC
  Administered 2018-03-16: 150 mg via ORAL
  Filled 2018-03-16: qty 1

## 2018-03-16 MED ORDER — CEPHALEXIN 500 MG PO CAPS: 500.0000 mg | ORAL_CAPSULE | Freq: Two times a day (BID) | ORAL | 0 refills | Status: DC

## 2018-03-16 MED ORDER — FLUCONAZOLE 150 MG PO TABS: 150.0000 mg | ORAL_TABLET | Freq: Every day | ORAL | 0 refills | Status: AC

## 2018-03-16 MED ORDER — CEPHALEXIN 250 MG PO CAPS
500.0000 mg | ORAL_CAPSULE | Freq: Once | ORAL | Status: AC
Start: 2018-03-16 — End: 2018-03-16
  Administered 2018-03-16: 500 mg via ORAL
  Filled 2018-03-16: qty 2

## 2018-03-16 MED ORDER — CETIRIZINE HCL 10 MG PO TABS: 10.0000 mg | ORAL_TABLET | Freq: Every day | ORAL | 0 refills | Status: DC

## 2018-03-16 MED ORDER — PHENAZOPYRIDINE HCL 200 MG PO TABS: 200.0000 mg | ORAL_TABLET | Freq: Three times a day (TID) | ORAL | 0 refills | Status: DC

## 2018-03-16 NOTE — ED Notes (Signed)
Patient states that she is having increased pain, and " I feel like I have a fever and I probably need an antibiotic"

## 2018-03-16 NOTE — Discharge Instructions (Signed)
Medications: Keflex, Diflucan, Zyrtec  Treatment: Take Keflex twice daily for 1 week.  Make sure to finish all this medication unless you called after your urine culture has resulted in a changes required.  Take Diflucan if your symptoms are not resolved after 3 days.  Take Zyrtec 1-2 times daily as needed for ear pressure and nasal congestion, as well as sneezing.  Follow-up: Please return to the emergency department if you develop any new or worsening symptoms including severe worsening pain, persistent fever over 100.4, intractable vomiting, or any other new or concerning symptom.  You will be called in 3 days if you test positive for gonorrhea or chlamydia.  If positive, please follow-up with health department for treatment.

## 2018-03-16 NOTE — ED Triage Notes (Signed)
Patient has multiple complaints. She reports that she has lower back pain, with increased urination and a "sensative" feeling in her peri area - patient states that she thinks that she has a UTI  - patient also reports that she is having left sided ear pain

## 2018-03-16 NOTE — ED Provider Notes (Signed)
[place] EMERGENCY DEPARTMENT Provider Note   CSN: [id number] Arrival date & time: 03/16/18  1104     History   Chief Complaint Chief Complaint  Patient presents with  . Otalgia  . Hematuria  . Back Pain    HPI Karen Gibson is a 42 y.o. female with history of DM, HTN, PCOS who presents with a 1 week history of dysuria, low back pain, and vaginal irritation and burning. She also reports a few day history of L ear pain, nasal congestion, and sneezing.  Patient is currently having vaginal bleeding.  She gets irregular periods.  She has had no other abnormal vaginal discharge.  She has low suspicion for STD exposure.  She frequently gets yeast infections and states she feels like she might have this in addition to UTI.  She denies any abdominal pain.  She had one episode of nausea, but no vomiting.  She denies any chest pain, shortness of breath, fevers.  HPI  Past Medical History:  Diagnosis Date  . Diabetes mellitus without complication ([place])   . Hypertension   . Polycystic ovarian syndrome     Patient Active Problem List   Diagnosis Date Noted  . CAP (community acquired pneumonia) 10/01/2017  . Tachycardia 10/01/2017  . Elevated troponin 10/01/2017  . Hypokalemia 10/01/2017  . Diabetes mellitus without complication ([place]) 56/38/7564  . Leukocytosis 10/01/2017    History reviewed. No pertinent surgical history.   OB History   None      Home Medications    Prior to Admission medications   Medication Sig Start Date End Date Taking? Authorizing Provider  carvedilol (COREG) 6.25 MG tablet Take 1 tablet (6.25 mg total) by mouth 2 (two) times daily with a meal. 01/13/18   Langston Masker B, PA-C  cephALEXin (KEFLEX) 500 MG capsule Take 1 capsule (500 mg total) by mouth 2 (two) times daily. 03/16/18   Dominick Zertuche, Bea Graff, PA-C  cetirizine (ZYRTEC ALLERGY) 10 MG tablet Take 1 tablet (10 mg total) by mouth daily. 03/16/18   Jacques Fife, Bea Graff, PA-C  cyclobenzaprine  (FLEXERIL) 10 MG tablet Take 1 tablet (10 mg total) by mouth 2 (two) times daily as needed for muscle spasms. 01/13/18   Langston Masker B, PA-C  fluconazole (DIFLUCAN) 150 MG tablet Take 1 tablet (150 mg total) by mouth daily for 1 day. If symptoms have not improved after first dose given in ED. 03/16/18 03/17/18  Kadar Chance, Bea Graff, PA-C  HYDROcodone-homatropine (HYCODAN) 5-1.5 MG/5ML syrup Take 5 mLs by mouth every 6 (six) hours as needed for cough. 10/04/17   Eugenie Filler, MD  ibuprofen (ADVIL,MOTRIN) 200 MG tablet Take 600 mg by mouth every 6 (six) hours as needed for moderate pain.    [provider]  metFORMIN (GLUCOPHAGE) 500 MG tablet Take 1 tablet (500 mg total) by mouth 2 (two) times daily with a meal. 01/13/18   Langston Masker B, PA-C  naproxen (NAPROSYN) 500 MG tablet Take 1 tablet (500 mg total) by mouth 2 (two) times daily. 03/16/18   Kida Digiulio, Bea Graff, PA-C  phenazopyridine (PYRIDIUM) 200 MG tablet Take 1 tablet (200 mg total) by mouth 3 (three) times daily. 03/16/18   Frederica Kuster, PA-C    Family History Family History  Problem Relation Age of Onset  . Diabetes Mother   . Heart failure Father   . Cancer Father   . Stroke Father     Social History Social History   Tobacco Use  . Smoking  status: Never Smoker  . Smokeless tobacco: Never Used  Substance Use Topics  . Alcohol use: Yes    Comment: occ  . Drug use: No     Allergies   Bee venom   Review of Systems Review of Systems  Constitutional: Negative for chills and fever.  HENT: Positive for congestion, ear pain and sneezing. Negative for facial swelling and sore throat.   Respiratory: Negative for shortness of breath.   Cardiovascular: Negative for chest pain.  Gastrointestinal: Positive for nausea. Negative for abdominal pain and vomiting.  Genitourinary: Positive for dysuria, frequency, hematuria (could be menstrual), urgency and vaginal bleeding (menstrual).  Musculoskeletal: Positive for back  pain (low).  Skin: Negative for rash and wound.  Neurological: Negative for headaches.  Psychiatric/Behavioral: The patient is not nervous/anxious.      Physical Exam Updated Vital Signs BP (!) 160/126 (BP Location: Right Arm)   Pulse 80   Temp 98.5 F (36.9 C) (Oral)   Resp 16   Ht 5\' 1"  (1.549 m)   Wt (!) 163.3 kg (360 lb)   SpO2 98%   BMI 68.02 kg/m   Physical Exam  Constitutional: She appears well-developed and well-nourished. No distress.  Morbidly obese  HENT:  Head: Normocephalic and atraumatic.  Right Ear: Tympanic membrane normal.  Left Ear: Tympanic membrane normal.  Mouth/Throat: Oropharynx is clear and moist. No oropharyngeal exudate.  Possibly some small amount of fluid behind the left TM, no bulging or erythema  Eyes: Pupils are equal, round, and reactive to light. Conjunctivae are normal. Right eye exhibits no discharge. Left eye exhibits no discharge. No scleral icterus.  Neck: Normal range of motion. Neck supple. No thyromegaly present.  Cardiovascular: Normal rate, regular rhythm, normal heart sounds and intact distal pulses. Exam reveals no gallop and no friction rub.  No murmur heard. Pulmonary/Chest: Effort normal and breath sounds normal. No stridor. No respiratory distress. She has no wheezes. She has no rales.  Abdominal: Soft. Bowel sounds are normal. She exhibits no distension. There is no tenderness. There is no rebound, no guarding and no CVA tenderness.  Genitourinary: Uterus normal. Pelvic exam was performed with patient supine. Right adnexum displays no tenderness. Left adnexum displays no tenderness. There is bleeding in the vagina.  Genitourinary Comments: Pelvic exam done with chaperone.  Generally uncomfortable due to vaginal irritation, but no adnexal tenderness.  due to patient's body habitus, I was unable to palpate the cervix to assess for CMT  Musculoskeletal: She exhibits no edema.  Lymphadenopathy:    She has no cervical adenopathy.    Neurological: She is alert. Coordination normal.  Skin: Skin is warm and dry. No rash noted. She is not diaphoretic. No pallor.  Psychiatric: She has a normal mood and affect.  Nursing note and vitals reviewed.    ED Treatments / Results  Labs (all labs ordered are listed, but only abnormal results are displayed) Labs Reviewed  WET PREP, GENITAL - Abnormal; Notable for the following components:      Result Value   Yeast Wet Prep HPF POC PRESENT (*)    WBC, Wet Prep HPF POC MANY (*)    All other components within normal limits  URINALYSIS, ROUTINE W REFLEX MICROSCOPIC - Abnormal; Notable for the following components:   APPearance CLOUDY (*)    Glucose, UA 250 (*)    Hgb urine dipstick LARGE (*)    Protein, ur 30 (*)    Nitrite POSITIVE (*)    Leukocytes, UA LARGE (*)  All other components within normal limits  URINALYSIS, MICROSCOPIC (REFLEX) - Abnormal; Notable for the following components:   Bacteria, UA MANY (*)    All other components within normal limits  URINE CULTURE  PREGNANCY, URINE  GC/CHLAMYDIA PROBE AMP ([place]) NOT AT [facility]    EKG None  Radiology No results found.  Procedures Procedures (including critical care time)  Medications Ordered in ED Medications  ketorolac (TORADOL) 30 MG/ML injection 30 mg (30 mg Intramuscular Given 03/16/18 1511)  cephALEXin (KEFLEX) capsule 500 mg (500 mg Oral Given 03/16/18 1638)  fluconazole (DIFLUCAN) tablet 150 mg (150 mg Oral Given 03/16/18 1637)     Initial Impression / Assessment and Plan / ED Course  I have reviewed the triage vital signs and the nursing notes.  Pertinent labs & imaging results that were available during my care of the patient were reviewed by me and considered in my medical decision making (see chart for details).     Patient with UTI and candidal vaginitis.  Will treat with Keflex and Diflucan.  We will also discharge home with Naprosyn and Pyridium for pain control.  No signs of  pyelonephritis at this time.  Urine will be sent for culture.  GC/chlamydia sent.  Patient is low suspicion for STD exposure and will defer treatment at this time.  Regarding patient's ear pain, no signs of otitis media at this time.  Will discharge home with Zyrtec to help with pressure.  Suspect related to viral syndrome versus allergy symptoms considering the nasal congestion and sneezing.  Patient advised to return if any significant changes.  Patient understands and agrees with plan.  Patient vitals stable and discharged in satisfactory condition.  Patient has blood pressure medication at home.  Final Clinical Impressions(s) / ED Diagnoses   Final diagnoses:  Lower urinary tract infectious disease  Left ear pain    ED Discharge Orders        Ordered    cetirizine (ZYRTEC ALLERGY) 10 MG tablet  Daily     03/16/18 1627    cephALEXin (KEFLEX) 500 MG capsule  2 times daily     03/16/18 1627    fluconazole (DIFLUCAN) 150 MG tablet  Daily     03/16/18 1627    naproxen (NAPROSYN) 500 MG tablet  2 times daily     03/16/18 1633    phenazopyridine (PYRIDIUM) 200 MG tablet  3 times daily     03/16/18 1633       Catheleen Langhorne, Bea Graff, PA-C 03/16/18 1659    Sherwood Gambler, MD 03/17/18 1414

## 2018-03-16 NOTE — ED Notes (Signed)
ED Provider at bedside. 

## 2018-03-16 NOTE — ED Notes (Signed)
Burning sensation when she urinates.

## 2018-03-18 LAB — GC/CHLAMYDIA PROBE AMP (~~LOC~~) NOT AT ARMC
Chlamydia: NEGATIVE
Neisseria Gonorrhea: NEGATIVE

## 2018-03-19 LAB — URINE CULTURE: Culture: >=100000 — AB

## 2018-03-20 ENCOUNTER — Telehealth: Payer: Self-pay | Admitting: *Deleted

## 2018-03-20 NOTE — Telephone Encounter (Signed)
Post ED Visit - Positive Culture Follow-up  Culture report reviewed by antimicrobial stewardship pharmacist:  []  Elenor Quinones, Pharm.D. []  Heide Guile, Pharm.D., BCPS AQ-ID []  Parks Neptune, Pharm.D., BCPS []  Alycia Rossetti, Pharm.D., BCPS []  John Sevier, Pharm.D., BCPS, AAHIVP []  Legrand Como, Pharm.D., BCPS, AAHIVP []  Salome Arnt, PharmD, BCPS []  Wynell Balloon, PharmD []  Vincenza Hews, PharmD, BCPS Angus Seller, PharmD  Positive urine culture Treated with Cephalexin, organism sensitive to the same and no further patient follow-up is required at this time.  Harlon Flor [facility] 03/20/2018, 10:32 AM

## 2018-10-14 ENCOUNTER — Encounter (HOSPITAL_BASED_OUTPATIENT_CLINIC_OR_DEPARTMENT_OTHER): Payer: Self-pay | Admitting: Emergency Medicine

## 2018-10-14 ENCOUNTER — Other Ambulatory Visit: Payer: Self-pay

## 2018-10-14 ENCOUNTER — Emergency Department (HOSPITAL_BASED_OUTPATIENT_CLINIC_OR_DEPARTMENT_OTHER)
Admission: EM | Admit: 2018-10-14 | Discharge: 2018-10-14 | Disposition: A | Discharge status: Home / Self Care | Payer: Self-pay | Attending: Emergency Medicine | Admitting: Emergency Medicine

## 2018-10-14 DIAGNOSIS — Z79899 Other long term (current) drug therapy: Secondary | ICD-10-CM | POA: Insufficient documentation

## 2018-10-14 DIAGNOSIS — Z9114 Patient's other noncompliance with medication regimen: Secondary | ICD-10-CM | POA: Insufficient documentation

## 2018-10-14 DIAGNOSIS — N3000 Acute cystitis without hematuria: Secondary | ICD-10-CM | POA: Insufficient documentation

## 2018-10-14 DIAGNOSIS — R197 Diarrhea, unspecified: Secondary | ICD-10-CM | POA: Insufficient documentation

## 2018-10-14 DIAGNOSIS — I10 Essential (primary) hypertension: Secondary | ICD-10-CM | POA: Insufficient documentation

## 2018-10-14 DIAGNOSIS — Z7984 Long term (current) use of oral hypoglycemic drugs: Secondary | ICD-10-CM | POA: Insufficient documentation

## 2018-10-14 DIAGNOSIS — E119 Type 2 diabetes mellitus without complications: Secondary | ICD-10-CM | POA: Insufficient documentation

## 2018-10-14 DIAGNOSIS — R112 Nausea with vomiting, unspecified: Secondary | ICD-10-CM | POA: Insufficient documentation

## 2018-10-14 LAB — URINALYSIS, MICROSCOPIC (REFLEX)

## 2018-10-14 LAB — CBC WITH DIFFERENTIAL/PLATELET
Abs Immature Granulocytes: 0.06 10*3/uL (ref 0.00–0.07)
Basophils Absolute: 0 10*3/uL (ref 0.0–0.1)
Basophils Relative: 1 %
EOS PCT: 3 %
Eosinophils Absolute: 0.3 10*3/uL (ref 0.0–0.5)
HCT: 37.5 % (ref 36.0–46.0)
Hemoglobin: 12.8 g/dL (ref 12.0–15.0)
Immature Granulocytes: 1 %
Lymphocytes Relative: 31 %
Lymphs Abs: 2.4 10*3/uL (ref 0.7–4.0)
MCH: 29.5 pg (ref 26.0–34.0)
MCHC: 34.1 g/dL (ref 30.0–36.0)
MCV: 86.4 fL (ref 80.0–100.0)
Monocytes Absolute: 0.5 10*3/uL (ref 0.1–1.0)
Monocytes Relative: 6 %
Neutro Abs: 4.5 10*3/uL (ref 1.7–7.7)
Neutrophils Relative %: 58 %
Platelets: 168 10*3/uL (ref 150–400)
RBC: 4.34 MIL/uL (ref 3.87–5.11)
RDW: 12.6 % (ref 11.5–15.5)
WBC: 7.7 10*3/uL (ref 4.0–10.5)
nRBC: 0 % (ref 0.0–0.2)

## 2018-10-14 LAB — BASIC METABOLIC PANEL
Anion gap: 6 (ref 5–15)
BUN: 13 mg/dL (ref 6–20)
CO2: 24 mmol/L (ref 22–32)
CREATININE: 0.63 mg/dL (ref 0.44–1.00)
Calcium: 9.1 mg/dL (ref 8.9–10.3)
Chloride: 103 mmol/L (ref 98–111)
GFR calc Af Amer: >60 mL/min (ref >60)
GFR calc non Af Amer: >60 mL/min (ref >60)
Glucose, Bld: 210 mg/dL — ABNORMAL HIGH (ref 70–99)
Potassium: 3.2 mmol/L — ABNORMAL LOW (ref 3.5–5.1)
Sodium: 133 mmol/L — ABNORMAL LOW (ref 135–145)

## 2018-10-14 LAB — URINALYSIS, ROUTINE W REFLEX MICROSCOPIC
BILIRUBIN URINE: NEGATIVE
Glucose, UA: NEGATIVE mg/dL
Ketones, ur: NEGATIVE mg/dL
NITRITE: POSITIVE — AB
PROTEIN: NEGATIVE mg/dL
Specific Gravity, Urine: 1.02 (ref 1.005–1.030)
pH: 6 (ref 5.0–8.0)

## 2018-10-14 LAB — CBG MONITORING, ED: GLUCOSE-CAPILLARY: 182 mg/dL — AB (ref 70–99)

## 2018-10-14 LAB — PREGNANCY, URINE: PREG TEST UR: NEGATIVE

## 2018-10-14 MED ORDER — CEPHALEXIN 500 MG PO CAPS: 500.0000 mg | ORAL_CAPSULE | Freq: Four times a day (QID) | ORAL | 0 refills | Status: DC

## 2018-10-14 MED ORDER — PHENAZOPYRIDINE HCL 100 MG PO TABS
200.0000 mg | ORAL_TABLET | Freq: Once | ORAL | Status: AC
  Administered 2018-10-14: 200 mg via ORAL
  Filled 2018-10-14: qty 2

## 2018-10-14 MED ORDER — METFORMIN HCL 500 MG PO TABS: 500.0000 mg | ORAL_TABLET | Freq: Two times a day (BID) | ORAL | 0 refills | Status: DC

## 2018-10-14 MED ORDER — ONDANSETRON 8 MG PO TBDP
8.0000 mg | ORAL_TABLET | Freq: Once | ORAL | Status: AC
  Administered 2018-10-14: 8 mg via ORAL
  Filled 2018-10-14: qty 1

## 2018-10-14 MED ORDER — METFORMIN HCL 500 MG PO TABS: 500.0000 mg | ORAL_TABLET | Freq: Two times a day (BID) | ORAL | 0 refills | Status: AC

## 2018-10-14 MED ORDER — FLUCONAZOLE 150 MG PO TABS: 150.0000 mg | ORAL_TABLET | Freq: Once | ORAL | 0 refills | Status: AC

## 2018-10-14 MED ORDER — ACETAMINOPHEN 500 MG PO TABS
1000.0000 mg | ORAL_TABLET | Freq: Once | ORAL | Status: AC
  Administered 2018-10-14: 1000 mg via ORAL
  Filled 2018-10-14: qty 2

## 2018-10-14 MED ORDER — METFORMIN HCL 500 MG PO TABS
500.0000 mg | ORAL_TABLET | Freq: Once | ORAL | Status: AC
  Administered 2018-10-14: 500 mg via ORAL
  Filled 2018-10-14: qty 1

## 2018-10-14 MED ORDER — PHENAZOPYRIDINE HCL 200 MG PO TABS: 200.0000 mg | ORAL_TABLET | Freq: Three times a day (TID) | ORAL | 0 refills | Status: DC

## 2018-10-14 MED ORDER — CEPHALEXIN 250 MG PO CAPS
500.0000 mg | ORAL_CAPSULE | ORAL | Status: AC
  Administered 2018-10-14: 500 mg via ORAL
  Filled 2018-10-14: qty 2

## 2018-10-14 NOTE — ED Provider Notes (Signed)
Patient would now like diflucan and a note for work, this was provided   Randal Buba, Nathalya Wolanski, MD 10/14/18 [phone]

## 2018-10-14 NOTE — ED Provider Notes (Signed)
[facility] EMERGENCY DEPARTMENT Provider Note   CSN: [id number] Arrival date & time: 10/14/18  0235     History   Chief Complaint Chief Complaint  Patient presents with  . multiple complaints    HPI Karen Gibson is a 43 y.o. female.  The history is provided by the patient.  Dysuria  Pain quality:  Burning Pain severity:  Moderate Onset quality:  Gradual Duration:  1 week Timing:  Constant Progression:  Worsening Chronicity:  Recurrent Recent urinary tract infections: yes   Relieved by:  Nothing Worsened by:  Nothing Ineffective treatments:  None tried Urinary symptoms: no discolored urine   Associated symptoms: nausea and vomiting   Associated symptoms: no abdominal pain, no flank pain, no genital lesions and no vaginal discharge   Risk factors: no hx of pyelonephritis   Also has had episodes of vomiting and diarrhea most recently on Saturday where she has 1 emesis and 2 loose stools.  No weakness no changes in vision or speech but would like multiple issues addressed as she does not have insurance.  States her sugars are consistently in the 400s.  Is not taking her BP or DM medications.  No changes in vision or speech no weakness, no facial asymmetry no chest pain, no Shortness or breath, no cough.  States she was told she needs an xray for chronic forearm paresthesias.    Past Medical History:  Diagnosis Date  . Diabetes mellitus without complication ([place])   . Hypertension   . Polycystic ovarian syndrome     Patient Active Problem List   Diagnosis Date Noted  . CAP (community acquired pneumonia) 10/01/2017  . Tachycardia 10/01/2017  . Elevated troponin 10/01/2017  . Hypokalemia 10/01/2017  . Diabetes mellitus without complication ([place]) 13/24/4010  . Leukocytosis 10/01/2017    History reviewed. No pertinent surgical history.   OB History   No obstetric history on file.      Home Medications    Prior to Admission medications   Medication  Sig Start Date End Date Taking? Authorizing Provider  lisinopril (PRINIVIL,ZESTRIL) 10 MG tablet Take 10 mg by mouth daily.   Yes [provider]  metFORMIN (GLUCOPHAGE) 500 MG tablet Take 1 tablet (500 mg total) by mouth 2 (two) times daily with a meal. 01/13/18  Yes Valere Dross, Alyssa B, PA-C  carvedilol (COREG) 6.25 MG tablet Take 1 tablet (6.25 mg total) by mouth 2 (two) times daily with a meal. 01/13/18   Langston Masker B, PA-C  cephALEXin (KEFLEX) 500 MG capsule Take 1 capsule (500 mg total) by mouth 2 (two) times daily. 03/16/18   Law, Bea Graff, PA-C  cetirizine (ZYRTEC ALLERGY) 10 MG tablet Take 1 tablet (10 mg total) by mouth daily. 03/16/18   Law, Bea Graff, PA-C  cyclobenzaprine (FLEXERIL) 10 MG tablet Take 1 tablet (10 mg total) by mouth 2 (two) times daily as needed for muscle spasms. 01/13/18   Langston Masker B, PA-C  HYDROcodone-homatropine (HYCODAN) 5-1.5 MG/5ML syrup Take 5 mLs by mouth every 6 (six) hours as needed for cough. 10/04/17   Eugenie Filler, MD  ibuprofen (ADVIL,MOTRIN) 200 MG tablet Take 600 mg by mouth every 6 (six) hours as needed for moderate pain.    [provider]  naproxen (NAPROSYN) 500 MG tablet Take 1 tablet (500 mg total) by mouth 2 (two) times daily. 03/16/18   Law, Bea Graff, PA-C  phenazopyridine (PYRIDIUM) 200 MG tablet Take 1 tablet (200 mg total) by mouth 3 (three)  times daily. 03/16/18   Frederica Kuster, PA-C    Family History Family History  Problem Relation Age of Onset  . Diabetes Mother   . Heart failure Father   . Cancer Father   . Stroke Father     Social History Social History   Tobacco Use  . Smoking status: Never Smoker  . Smokeless tobacco: Never Used  Substance Use Topics  . Alcohol use: Yes    Comment: occ  . Drug use: No     Allergies   Bee venom   Review of Systems Review of Systems  Constitutional: Negative for diaphoresis.  HENT: Negative for facial swelling.   Respiratory: Negative for cough  and shortness of breath.   Cardiovascular: Negative for chest pain, palpitations and leg swelling.  Gastrointestinal: Positive for diarrhea, nausea and vomiting. Negative for abdominal pain.  Genitourinary: Positive for dysuria. Negative for flank pain, genital sores, hematuria and vaginal discharge.  Musculoskeletal: Negative for myalgias.  Neurological: Negative for dizziness, tremors, seizures, syncope, facial asymmetry, speech difficulty, weakness and numbness.  All other systems reviewed and are negative.    Physical Exam Updated Vital Signs BP (!) 172/102 (BP Location: Right Arm)   Pulse (!) 103   Temp 98.5 F (36.9 C)   Resp 20   Ht 5\' 1"  (1.549 m)   SpO2 100%   BMI 68.02 kg/m   Physical Exam Constitutional:      Appearance: Normal appearance. She is obese. She is not ill-appearing or toxic-appearing.  HENT:     Head: Normocephalic and atraumatic.     Right Ear: Tympanic membrane normal.     Left Ear: Tympanic membrane normal.     Nose: Nose normal.     Mouth/Throat:     Mouth: Mucous membranes are moist.     Pharynx: Oropharynx is clear.  Eyes:     Extraocular Movements: Extraocular movements intact.     Conjunctiva/sclera: Conjunctivae normal.     Pupils: Pupils are equal, round, and reactive to light.  Neck:     Musculoskeletal: Normal range of motion and neck supple.  Cardiovascular:     Rate and Rhythm: Normal rate and regular rhythm.     Pulses: Normal pulses.     Heart sounds: Normal heart sounds.  Pulmonary:     Effort: Pulmonary effort is normal.     Breath sounds: Normal breath sounds.  Abdominal:     General: Abdomen is flat. Bowel sounds are normal.     Palpations: There is no mass.     Tenderness: There is no abdominal tenderness. There is no guarding or rebound.     Hernia: No hernia is present.  Musculoskeletal: Normal range of motion.  Skin:    General: Skin is warm and dry.     Capillary Refill: Capillary refill takes less than 2 seconds.    Neurological:     General: No focal deficit present.     Mental Status: She is alert and oriented to person, place, and time.     Cranial Nerves: No cranial nerve deficit.     Motor: No weakness.     Coordination: Coordination normal.     Deep Tendon Reflexes: Reflexes normal.  Psychiatric:        Mood and Affect: Mood normal.      ED Treatments / Results  Labs (all labs ordered are listed, but only abnormal results are displayed) Labs Reviewed  URINALYSIS, ROUTINE W REFLEX MICROSCOPIC - Abnormal; Notable for the  following components:      Result Value   APPearance HAZY (*)    Hgb urine dipstick MODERATE (*)    Nitrite POSITIVE (*)    Leukocytes, UA LARGE (*)    All other components within normal limits  URINALYSIS, MICROSCOPIC (REFLEX) - Abnormal; Notable for the following components:   Bacteria, UA MANY (*)    All other components within normal limits  CBG MONITORING, ED - Abnormal; Notable for the following components:   Glucose-Capillary 182 (*)    All other components within normal limits  PREGNANCY, URINE  CBC WITH DIFFERENTIAL/PLATELET  BASIC METABOLIC PANEL    EKG None  Radiology No results found.  Procedures Procedures (including critical care time)  Medications Ordered in ED Medications  cephALEXin (KEFLEX) capsule 500 mg (has no administration in time range)  phenazopyridine (PYRIDIUM) tablet 200 mg (has no administration in time range)  ondansetron (ZOFRAN-ODT) disintegrating tablet 8 mg (8 mg Oral Given 10/14/18 0305)  acetaminophen (TYLENOL) tablet 1,000 mg (1,000 mg Oral Given 10/14/18 0304)      Final Clinical Impressions(s) / ED Diagnoses   There is no indication for imaging of this arm it needs to be seen by neurology for EMG.  Moreover, BP and sugar are high due to medication noncompliance.  Will provide RX for BP med and metformin.  Low carb diet and close follow up with PMD.  RX for antibiotic for UTI.    Return for pain, intractable  cough, productive cough,fevers >100.4 unrelieved by medication, shortness of breath, intractable vomiting, or diarrhea, abdominal pain, Inability to tolerate liquids or food, cough, altered mental status or any concerns. No signs of systemic illness or infection. The patient is nontoxic-appearing on exam and vital signs are within normal limits.   I have reviewed the triage vital signs and the nursing notes. Pertinent labs &imaging results that were available during my care of the patient were reviewed by me and considered in my medical decision making (see chart for details).  After history, exam, and medical workup I feel the patient has been appropriately medically screened and is safe for discharge home. Pertinent diagnoses were discussed with the patient. Patient was given return precautions.    Cherron Blitzer, MD 10/14/18 [id number]

## 2018-10-14 NOTE — ED Notes (Addendum)
Pt c/o n/v/d, HTN, high blood sugar x 2 weeks. Pt also states she has not taken her Metformin or Lisinopril x 2 weeks. CBG done at triage, and was 182. Pt also reports dysuria.  Reports one episode of vomiting today, and one BM today. States " I have had some episodes of diarrhea" with last episode Saturday (total of 2 episodes on Saturday). Reports intermittent fever, and body aches. Afebrile presently. Last dose of Ibuprofen at 1900 yesterday.

## 2018-10-14 NOTE — ED Triage Notes (Signed)
Body aches , fever and diarrhea/vomtiing x2 weeks. Reports progressively worse. L arm numbness intermittently for several months. Reports blood sugars in the 400s. Possible UTI, reports burning with urination. HX PCS, diabetes.

## 2018-10-14 NOTE — ED Notes (Signed)
Attempted straight stick. Pt did not tolerate well. Pt complained of pain during stick, so stick was stopped at patient's request, and another nurse asked to retry.

## 2018-10-26 ENCOUNTER — Emergency Department (HOSPITAL_BASED_OUTPATIENT_CLINIC_OR_DEPARTMENT_OTHER)
Admission: EM | Admit: 2018-10-26 | Discharge: 2018-10-26 | Disposition: A | Discharge status: Home / Self Care | Payer: BLUE CROSS/BLUE SHIELD | Attending: Emergency Medicine | Admitting: Emergency Medicine

## 2018-10-26 ENCOUNTER — Other Ambulatory Visit: Payer: Self-pay

## 2018-10-26 ENCOUNTER — Encounter (HOSPITAL_BASED_OUTPATIENT_CLINIC_OR_DEPARTMENT_OTHER): Payer: Self-pay | Admitting: *Deleted

## 2018-10-26 ENCOUNTER — Emergency Department (HOSPITAL_BASED_OUTPATIENT_CLINIC_OR_DEPARTMENT_OTHER): Payer: BLUE CROSS/BLUE SHIELD

## 2018-10-26 DIAGNOSIS — R05 Cough: Secondary | ICD-10-CM | POA: Diagnosis present

## 2018-10-26 DIAGNOSIS — I10 Essential (primary) hypertension: Secondary | ICD-10-CM | POA: Diagnosis not present

## 2018-10-26 DIAGNOSIS — E1165 Type 2 diabetes mellitus with hyperglycemia: Secondary | ICD-10-CM | POA: Diagnosis not present

## 2018-10-26 DIAGNOSIS — Z79899 Other long term (current) drug therapy: Secondary | ICD-10-CM | POA: Diagnosis not present

## 2018-10-26 DIAGNOSIS — R739 Hyperglycemia, unspecified: Secondary | ICD-10-CM

## 2018-10-26 DIAGNOSIS — J209 Acute bronchitis, unspecified: Secondary | ICD-10-CM | POA: Insufficient documentation

## 2018-10-26 LAB — URINALYSIS, ROUTINE W REFLEX MICROSCOPIC
Bilirubin Urine: NEGATIVE
Glucose, UA: NEGATIVE mg/dL
Ketones, ur: NEGATIVE mg/dL
NITRITE: NEGATIVE
Protein, ur: NEGATIVE mg/dL
Specific Gravity, Urine: <1.005 — ABNORMAL LOW (ref 1.005–1.030)
pH: 6 (ref 5.0–8.0)

## 2018-10-26 LAB — CBC WITH DIFFERENTIAL/PLATELET
Abs Immature Granulocytes: 0.05 10*3/uL (ref 0.00–0.07)
Basophils Absolute: 0 10*3/uL (ref 0.0–0.1)
Basophils Relative: 1 %
Eosinophils Absolute: 0.1 10*3/uL (ref 0.0–0.5)
Eosinophils Relative: 2 %
HCT: 36.7 % (ref 36.0–46.0)
Hemoglobin: 12.3 g/dL (ref 12.0–15.0)
Immature Granulocytes: 1 %
LYMPHS ABS: 0.6 10*3/uL — AB (ref 0.7–4.0)
Lymphocytes Relative: 10 %
MCH: 29.6 pg (ref 26.0–34.0)
MCHC: 33.5 g/dL (ref 30.0–36.0)
MCV: 88.2 fL (ref 80.0–100.0)
MONOS PCT: 6 %
Monocytes Absolute: 0.4 10*3/uL (ref 0.1–1.0)
Neutro Abs: 4.8 10*3/uL (ref 1.7–7.7)
Neutrophils Relative %: 80 %
Platelets: 144 10*3/uL — ABNORMAL LOW (ref 150–400)
RBC: 4.16 MIL/uL (ref 3.87–5.11)
RDW: 12.6 % (ref 11.5–15.5)
WBC: 5.9 10*3/uL (ref 4.0–10.5)
nRBC: 0 % (ref 0.0–0.2)

## 2018-10-26 LAB — URINALYSIS, MICROSCOPIC (REFLEX): Bacteria, UA: NONE SEEN

## 2018-10-26 LAB — BASIC METABOLIC PANEL
Anion gap: 4 — ABNORMAL LOW (ref 5–15)
BUN: 11 mg/dL (ref 6–20)
CO2: 25 mmol/L (ref 22–32)
CREATININE: 0.72 mg/dL (ref 0.44–1.00)
Calcium: 8.7 mg/dL — ABNORMAL LOW (ref 8.9–10.3)
Chloride: 104 mmol/L (ref 98–111)
GFR calc non Af Amer: >60 mL/min (ref >60)
Glucose, Bld: 202 mg/dL — ABNORMAL HIGH (ref 70–99)
Potassium: 3.6 mmol/L (ref 3.5–5.1)
SODIUM: 133 mmol/L — AB (ref 135–145)

## 2018-10-26 LAB — CBG MONITORING, ED: GLUCOSE-CAPILLARY: 185 mg/dL — AB (ref 70–99)

## 2018-10-26 MED ORDER — ALBUTEROL SULFATE HFA 108 (90 BASE) MCG/ACT IN AERS
2.0000 | INHALATION_SPRAY | Freq: Once | RESPIRATORY_TRACT | Status: AC
  Administered 2018-10-26: 2 via RESPIRATORY_TRACT
  Filled 2018-10-26: qty 6.7

## 2018-10-26 MED ORDER — FLUCONAZOLE 150 MG PO TABS: 150.0000 mg | ORAL_TABLET | Freq: Once | ORAL | 0 refills | Status: AC

## 2018-10-26 MED ORDER — IPRATROPIUM-ALBUTEROL 0.5-2.5 (3) MG/3ML IN SOLN
3.0000 mL | Freq: Once | RESPIRATORY_TRACT | Status: AC
  Administered 2018-10-26: 3 mL via RESPIRATORY_TRACT
  Filled 2018-10-26: qty 3

## 2018-10-26 MED ORDER — ALBUTEROL SULFATE (2.5 MG/3ML) 0.083% IN NEBU: 5.0000 mg | INHALATION_SOLUTION | Freq: Once | RESPIRATORY_TRACT | Status: DC

## 2018-10-26 MED ORDER — IBUPROFEN 800 MG PO TABS
800.0000 mg | ORAL_TABLET | Freq: Once | ORAL | Status: AC
  Administered 2018-10-26: 800 mg via ORAL

## 2018-10-26 MED ORDER — BENZONATATE 100 MG PO CAPS: 100.0000 mg | ORAL_CAPSULE | Freq: Three times a day (TID) | ORAL | 0 refills | Status: DC

## 2018-10-26 MED ORDER — IBUPROFEN 800 MG PO TABS
ORAL_TABLET | ORAL | Status: AC
  Filled 2018-10-26: qty 1

## 2018-10-26 NOTE — Discharge Instructions (Addendum)
You have acute bronchitis, this is most commonly viral, and your chest x-ray looks good today as well as the rest of your labs.  Use inhaler every 4 hours as needed for cough, shortness of breath and chest tightness.  You may also take cough medication as prescribed.  Your blood sugar was somewhat elevated today as well as your blood pressure and I would like for you discussed this with your primary care doctor.  Call Monday morning to schedule follow-up with your primary doctor.  Return to the emergency department for high fevers, worsening cough, chest pain, shortness of breath or increased work of breathing or any other new or concerning symptoms.

## 2018-10-26 NOTE — ED Triage Notes (Signed)
Pt reports cough, SOB, chest "heaviness"  today after climbing stairs

## 2018-10-26 NOTE — ED Notes (Signed)
Patient transported to X-ray 

## 2018-10-26 NOTE — ED Notes (Signed)
Pt requested a dose of ibuprofen at d/c. PA was aware and dose ordered.

## 2018-10-26 NOTE — ED Provider Notes (Signed)
[facility] EMERGENCY DEPARTMENT Provider Note   CSN: [id number] Arrival date & time: 10/26/18  2115     History   Chief Complaint Chief Complaint  Patient presents with  . Shortness of Breath    HPI Karen Gibson is a 43 y.o. female.  Karen Gibson is a 43 y.o. female with a history of diabetes, hypertension and PCOS, who presents to the emergency department for evaluation of cough.  She reports cough started suddenly yesterday she reports cough is primarily dry but she has severe spasms of cough where she feels like she cannot stop.  When this occurs she starts to feel some chest tightness and shortness of breath but this resolves when she is able to stop coughing and she denies any chest pain at rest outside of coughing.  Reports she had a coughing spell while she was walking up the stairs at her sister's house and this is when her shortness of breath got most severe.  No pleuritic pain.  She has not had any fevers or chills has had some mild rhinorrhea and nasal congestion, denies sore throat but does report it feels a bit scratchy from all the coughing.  No headache, neck pain or stiffness, no abdominal pain, nausea or vomiting.  Patient was recently treated for a urinary tract infection but reports she thinks this has had for the most part resolved.  She was given a Diflucan tablet to take after her antibiotic due to yeast infection and reports this has improved her symptoms but she still having a little bit of discharge and reports she often has to take a second Diflucan tablet and is requesting this.  She denies any lower extremity swelling or pain.  Has been taking cough drops for her symptoms but has not tried anything else, denies any other aggravating or alleviating factors.  Patient reports that a few years ago she had sepsis from pneumonia and now every time she gets a cough she becomes very concerned that she is going to become septic again.  Patient is concerned and very  anxious that her blood pressure and heart rate are a little bit elevated today as well.     Past Medical History:  Diagnosis Date  . Diabetes mellitus without complication ([place])   . Hypertension   . Polycystic ovarian syndrome     Patient Active Problem List   Diagnosis Date Noted  . CAP (community acquired pneumonia) 10/01/2017  . Tachycardia 10/01/2017  . Elevated troponin 10/01/2017  . Hypokalemia 10/01/2017  . Diabetes mellitus without complication ([place]) 00/86/7619  . Leukocytosis 10/01/2017    History reviewed. No pertinent surgical history.   OB History   No obstetric history on file.      Home Medications    Prior to Admission medications   Medication Sig Start Date End Date Taking? Authorizing Provider  benzonatate (TESSALON) 100 MG capsule Take 1 capsule (100 mg total) by mouth every 8 (eight) hours. 10/26/18   Jacqlyn Larsen, PA-C  carvedilol (COREG) 6.25 MG tablet Take 1 tablet (6.25 mg total) by mouth 2 (two) times daily with a meal. 01/13/18   Langston Masker B, PA-C  cephALEXin (KEFLEX) 500 MG capsule Take 1 capsule (500 mg total) by mouth 2 (two) times daily. 03/16/18   Law, Bea Graff, PA-C  cephALEXin (KEFLEX) 500 MG capsule Take 1 capsule (500 mg total) by mouth 4 (four) times daily. 10/14/18   Palumbo, April, MD  cetirizine (ZYRTEC ALLERGY) 10 MG tablet  Take 1 tablet (10 mg total) by mouth daily. 03/16/18   Law, Bea Graff, PA-C  cyclobenzaprine (FLEXERIL) 10 MG tablet Take 1 tablet (10 mg total) by mouth 2 (two) times daily as needed for muscle spasms. 01/13/18   Langston Masker B, PA-C  fluconazole (DIFLUCAN) 150 MG tablet Take 1 tablet (150 mg total) by mouth once for 1 dose. 10/26/18 10/26/18  Jacqlyn Larsen, PA-C  HYDROcodone-homatropine (HYCODAN) 5-1.5 MG/5ML syrup Take 5 mLs by mouth every 6 (six) hours as needed for cough. 10/04/17   Eugenie Filler, MD  ibuprofen (ADVIL,MOTRIN) 200 MG tablet Take 600 mg by mouth every 6 (six) hours as needed for moderate  pain.    [provider]  lisinopril (PRINIVIL,ZESTRIL) 10 MG tablet Take 10 mg by mouth daily.    [provider]  metFORMIN (GLUCOPHAGE) 500 MG tablet Take 1 tablet (500 mg total) by mouth 2 (two) times daily with a meal. 01/13/18   Langston Masker B, PA-C  metFORMIN (GLUCOPHAGE) 500 MG tablet Take 1 tablet (500 mg total) by mouth 2 (two) times daily with a meal. 10/14/18   Palumbo, April, MD  naproxen (NAPROSYN) 500 MG tablet Take 1 tablet (500 mg total) by mouth 2 (two) times daily. 03/16/18   Law, Bea Graff, PA-C  phenazopyridine (PYRIDIUM) 200 MG tablet Take 1 tablet (200 mg total) by mouth 3 (three) times daily. 03/16/18   Law, Bea Graff, PA-C  phenazopyridine (PYRIDIUM) 200 MG tablet Take 1 tablet (200 mg total) by mouth 3 (three) times daily. 10/14/18   Palumbo, April, MD    Family History Family History  Problem Relation Age of Onset  . Diabetes Mother   . Heart failure Father   . Cancer Father   . Stroke Father     Social History Social History   Tobacco Use  . Smoking status: Never Smoker  . Smokeless tobacco: Never Used  Substance Use Topics  . Alcohol use: Yes    Comment: occ  . Drug use: No     Allergies   Bee venom   Review of Systems Review of Systems  Constitutional: Negative for chills and fever.  HENT: Positive for congestion and rhinorrhea. Negative for ear pain and sore throat.   Eyes: Negative for visual disturbance.  Respiratory: Positive for cough, chest tightness and shortness of breath. Negative for wheezing.   Cardiovascular: Negative for chest pain and leg swelling.  Gastrointestinal: Negative for abdominal pain, nausea and vomiting.  Genitourinary: Negative for dysuria and frequency.  Musculoskeletal: Negative for arthralgias and myalgias.  Skin: Negative for color change and rash.  Neurological: Negative for dizziness, syncope and light-headedness.     Physical Exam Updated Vital Signs BP (!) 202/102 (BP Location: Left  Arm)   Pulse (!) 109   Temp 99.7 F (37.6 C) (Tympanic)   Resp 20   Ht 5\' 1"  (1.549 m)   Wt (!) 151.5 kg   SpO2 100%   BMI 63.11 kg/m   Physical Exam Vitals signs and nursing note reviewed.  Constitutional:      General: She is not in acute distress.    Appearance: She is well-developed. She is obese. She is not ill-appearing or diaphoretic.  HENT:     Head: Normocephalic and atraumatic.     Mouth/Throat:     Mouth: Mucous membranes are moist.     Pharynx: Oropharynx is clear.     Comments: Posterior oropharynx clear and mucous membranes moist, there is mild erythema but  no edema or tonsillar exudates, uvula midline, normal phonation, no trismus, tolerating secretions without difficulty. Eyes:     General:        Right eye: No discharge.        Left eye: No discharge.  Neck:     Musculoskeletal: Neck supple.     Trachea: No tracheal deviation.     Comments: No rigidity Pulmonary:     Effort: Pulmonary effort is normal. No respiratory distress.     Breath sounds: Normal breath sounds.     Comments: Respirations equal and unlabored, patient able to speak in full sentences, lungs clear to auscultation bilaterally Dry cough frequently during exam Chest:     Chest wall: No tenderness.  Abdominal:     General: Bowel sounds are normal.     Palpations: Abdomen is soft.     Tenderness: There is no abdominal tenderness. There is no guarding or rebound.     Comments: Abdomen soft, nondistended, nontender to palpation in all quadrants without guarding or peritoneal signs  Musculoskeletal:     Right lower leg: She exhibits no tenderness. No edema.     Left lower leg: She exhibits no tenderness. No edema.  Lymphadenopathy:     Cervical: No cervical adenopathy.  Skin:    General: Skin is warm and dry.     Findings: No ecchymosis or rash.  Neurological:     Mental Status: She is alert and oriented to person, place, and time.     Coordination: Coordination normal.  Psychiatric:         Mood and Affect: Mood normal.        Behavior: Behavior normal.      ED Treatments / Results  Labs (all labs ordered are listed, but only abnormal results are displayed) Labs Reviewed  URINALYSIS, ROUTINE W REFLEX MICROSCOPIC - Abnormal; Notable for the following components:      Result Value   Specific Gravity, Urine <1.005 (*)    Hgb urine dipstick MODERATE (*)    Leukocytes, UA TRACE (*)    All other components within normal limits  BASIC METABOLIC PANEL - Abnormal; Notable for the following components:   Sodium 133 (*)    Glucose, Bld 202 (*)    Calcium 8.7 (*)    Anion gap 4 (*)    All other components within normal limits  CBC WITH DIFFERENTIAL/PLATELET - Abnormal; Notable for the following components:   Platelets 144 (*)    Lymphs Abs 0.6 (*)    All other components within normal limits  CBG MONITORING, ED - Abnormal; Notable for the following components:   Glucose-Capillary 185 (*)    All other components within normal limits  URINALYSIS, MICROSCOPIC (REFLEX)    EKG None  Radiology Dg Chest 2 View  Result Date: 10/26/2018 CLINICAL DATA:  Cough since yesterday with low grade fever. EXAM: CHEST - 2 VIEW COMPARISON:  10/01/2017 FINDINGS: The heart size and mediastinal contours are within normal limits. Both lungs are clear. The visualized skeletal structures are unremarkable. IMPRESSION: No active cardiopulmonary disease. Electronically Signed   By: Ashley Royalty M.D.   On: 10/26/2018 22:07    Procedures Procedures (including critical care time)  Medications Ordered in ED Medications  ipratropium-albuterol (DUONEB) 0.5-2.5 (3) MG/3ML nebulizer solution 3 mL (3 mLs Nebulization Given 10/26/18 2224)  albuterol (PROVENTIL HFA;VENTOLIN HFA) 108 (90 Base) MCG/ACT inhaler 2 puff (2 puffs Inhalation Given 10/26/18 2306)  ibuprofen (ADVIL,MOTRIN) tablet 800 mg (800 mg Oral Given 10/26/18  2316)     Initial Impression / Assessment and Plan / ED Course  I have reviewed the  triage vital signs and the nursing notes.  Pertinent labs & imaging results that were available during my care of the patient were reviewed by me and considered in my medical decision making (see chart for details).  Patient presents for persistent cough that started yesterday.  On arrival patient is mildly tachycardic with low-grade fever of 99.7 and is hypertensive but does appear extremely anxious on exam.  During coughing she has some chest soreness and shortness of breath but the symptoms resolved a spasms of cough stop.  She does not have any persistent chest pain.  She is not having any lower extremity swelling or pain.  Patient is breathing comfortably is not tachypneic or hypoxic and lungs are clear on exam.  I feel patient's symptoms are likely due to bronchitis, likely viral etiology.  Patient is very concerned about her symptoms will check basic labs and chest x-ray and give breathing treatment to see if this helps with patient's cough.  Chest x-ray shows no evidence of pneumonia or other active cardiopulmonary disease and labs overall reassuring, urinalysis shows that patient's recent UTI has resolved, patient has no leukocytosis, normal hemoglobin, glucose is slightly elevated at 202 with expected sodium of 133 but no other acute electrolyte derangements and normal renal function, will have patient continue with her metformin and follow-up with her primary care doctor for continued management of her diabetes.  I have discussed reassuring results with the patient, her tachycardia has improved and heart rate down to 96 before patient received albuterol and blood pressure is improving as well.  Patient is not showing signs of hypertensive urgency or emergency.  We will have her continue taking her home blood pressure medication and follow-up with her PCP regarding this.  At this time feel patient is stable for discharge home, she was provided inhaler and will be prescribed Tessalon Perles for cough,  given additional tablet of Diflucan as requested and will have her follow-up with PCP if vaginal discharge is not improving.  Return precautions discussed.  Patient expresses understanding and agreement with plan.  Discharged home in good condition.  Vitals:   10/26/18 2203 10/26/18 2233 10/26/18 2309 10/26/18 2310  BP: (!) 172/94   (!) 164/83  Pulse: 96   (!) 106  Resp: 20   20  Temp:      TempSrc:      SpO2: 99% 98% 98% 98%  Weight:      Height:         Final Clinical Impressions(s) / ED Diagnoses   Final diagnoses:  Acute bronchitis, unspecified organism  Hyperglycemia    ED Discharge Orders         Ordered    benzonatate (TESSALON) 100 MG capsule  Every 8 hours     10/26/18 2259    fluconazole (DIFLUCAN) 150 MG tablet   Once     10/26/18 2302           Janet Berlin 10/28/18 1335    Elnora Morrison, MD 11/01/18 [phone]

## 2018-12-09 ENCOUNTER — Telehealth: Payer: Self-pay | Admitting: Neurology

## 2018-12-09 NOTE — Telephone Encounter (Signed)
Patient was instructed to call back to reschedule new patient appt.  Please follow up.  Thank you.

## 2018-12-09 NOTE — Telephone Encounter (Signed)
I could not find the referring note for neurological evaluations,  She was seen in the emergency room in February 2020 for dry cough, chest x-ray was negative at that time,  I have left a message for her to call back our office to reschedule

## 2018-12-10 ENCOUNTER — Ambulatory Visit: Payer: Self-pay | Admitting: Neurology

## 2019-06-15 ENCOUNTER — Emergency Department (HOSPITAL_BASED_OUTPATIENT_CLINIC_OR_DEPARTMENT_OTHER)
Admission: EM | Admit: 2019-06-15 | Discharge: 2019-06-15 | Disposition: A | Discharge status: Home / Self Care | Payer: BC Managed Care – PPO | Attending: Emergency Medicine | Admitting: Emergency Medicine

## 2019-06-15 ENCOUNTER — Telehealth (HOSPITAL_BASED_OUTPATIENT_CLINIC_OR_DEPARTMENT_OTHER): Payer: Self-pay | Admitting: Emergency Medicine

## 2019-06-15 ENCOUNTER — Encounter (HOSPITAL_BASED_OUTPATIENT_CLINIC_OR_DEPARTMENT_OTHER): Payer: Self-pay | Admitting: Emergency Medicine

## 2019-06-15 ENCOUNTER — Other Ambulatory Visit: Payer: Self-pay

## 2019-06-15 ENCOUNTER — Emergency Department (HOSPITAL_BASED_OUTPATIENT_CLINIC_OR_DEPARTMENT_OTHER): Payer: BC Managed Care – PPO

## 2019-06-15 DIAGNOSIS — Y998 Other external cause status: Secondary | ICD-10-CM | POA: Insufficient documentation

## 2019-06-15 DIAGNOSIS — S3992XA Unspecified injury of lower back, initial encounter: Secondary | ICD-10-CM

## 2019-06-15 DIAGNOSIS — E119 Type 2 diabetes mellitus without complications: Secondary | ICD-10-CM | POA: Diagnosis not present

## 2019-06-15 DIAGNOSIS — I10 Essential (primary) hypertension: Secondary | ICD-10-CM | POA: Diagnosis not present

## 2019-06-15 DIAGNOSIS — Y9289 Other specified places as the place of occurrence of the external cause: Secondary | ICD-10-CM | POA: Insufficient documentation

## 2019-06-15 DIAGNOSIS — Y9301 Activity, walking, marching and hiking: Secondary | ICD-10-CM | POA: Diagnosis not present

## 2019-06-15 DIAGNOSIS — W010XXA Fall on same level from slipping, tripping and stumbling without subsequent striking against object, initial encounter: Secondary | ICD-10-CM | POA: Diagnosis not present

## 2019-06-15 DIAGNOSIS — Z79899 Other long term (current) drug therapy: Secondary | ICD-10-CM | POA: Diagnosis not present

## 2019-06-15 DIAGNOSIS — R300 Dysuria: Secondary | ICD-10-CM | POA: Diagnosis not present

## 2019-06-15 LAB — URINALYSIS, ROUTINE W REFLEX MICROSCOPIC
Bilirubin Urine: NEGATIVE
Glucose, UA: NEGATIVE mg/dL
Hgb urine dipstick: NEGATIVE
Ketones, ur: NEGATIVE mg/dL
Leukocytes,Ua: NEGATIVE
Nitrite: NEGATIVE
Protein, ur: NEGATIVE mg/dL
Specific Gravity, Urine: 1.02 (ref 1.005–1.030)
pH: 6 (ref 5.0–8.0)

## 2019-06-15 LAB — PREGNANCY, URINE: Preg Test, Ur: NEGATIVE

## 2019-06-15 MED ORDER — IBUPROFEN 800 MG PO TABS: 800.0000 mg | ORAL_TABLET | Freq: Three times a day (TID) | ORAL | 0 refills | Status: DC | PRN

## 2019-06-15 MED ORDER — HYDROCODONE-ACETAMINOPHEN 5-325 MG PO TABS: 1.0000 | ORAL_TABLET | ORAL | 0 refills | Status: DC | PRN

## 2019-06-15 MED ORDER — HYDROCODONE-ACETAMINOPHEN 5-325 MG PO TABS: 1.0000 | ORAL_TABLET | ORAL | 0 refills | Status: AC | PRN

## 2019-06-15 NOTE — ED Triage Notes (Signed)
Pt reports fall about an hour ago, states she wasn't paying attention to where she was going and tripped on break in sidewalk. Thinks she hit her head. Lower back pain. Also complaining of UTI symptoms, dysuria starting today.Chronic UTIs.

## 2019-06-15 NOTE — ED Notes (Signed)
Patient transported to X-ray 

## 2019-06-15 NOTE — Telephone Encounter (Signed)
Pt called department stating that pharmacy does not have her RX for hydrocodone. Verified with Insurance account manager at [facility] on [address].They have not received RX. [place] MD notified, resending RX at this time.

## 2019-06-15 NOTE — Telephone Encounter (Signed)
Patient had prescription sent to the wrong pharmacy.  Prescriptions were corrected.

## 2019-06-15 NOTE — Telephone Encounter (Signed)
Resent to new pharmacy her scripts due to pharmacy being closed.

## 2019-06-15 NOTE — Telephone Encounter (Signed)
Patient had prescription sent to the wrong pharmacy.  This was corrected.

## 2019-06-15 NOTE — ED Notes (Signed)
Notified lab to add on urine culture 

## 2019-06-15 NOTE — ED Notes (Signed)
ED Provider at bedside. 

## 2019-06-15 NOTE — ED Provider Notes (Signed)
[facility] Provider Note: Georgena Spurling, MD, FACEP  CSN: [id number] MRN: [id number] ARRIVAL: 06/15/19 at [place]: [place]  [place]   HISTORY OF PRESENT ILLNESS  06/15/19 2:09 AM Karen Gibson is a 43 y.o. female who tripped on an uneven sidewalk at home 2 to 3 hours ago.  She injured her lower back.  She has pain in her lower back which she rates as a 9 out of 10.  It is worse with movement or palpation.  She denies associated numbness or weakness.  She also has dysuria and has a history of chronic urinary tract infections.  Past Medical History:  Diagnosis Date  . Diabetes mellitus without complication ([place])   . Hypertension   . Polycystic ovarian syndrome     History reviewed. No pertinent surgical history.  Family History  Problem Relation Age of Onset  . Diabetes Mother   . Heart failure Father   . Cancer Father   . Stroke Father     Social History   Tobacco Use  . Smoking status: Never Smoker  . Smokeless tobacco: Never Used  Substance Use Topics  . Alcohol use: Yes    Comment: occ  . Drug use: No    Prior to Admission medications   Medication Sig Start Date End Date Taking? Authorizing Provider  benzonatate (TESSALON) 100 MG capsule Take 1 capsule (100 mg total) by mouth every 8 (eight) hours. 10/26/18   Jacqlyn Larsen, PA-C  carvedilol (COREG) 6.25 MG tablet Take 1 tablet (6.25 mg total) by mouth 2 (two) times daily with a meal. 01/13/18   Langston Masker B, PA-C  cephALEXin (KEFLEX) 500 MG capsule Take 1 capsule (500 mg total) by mouth 2 (two) times daily. 03/16/18   Law, Bea Graff, PA-C  cephALEXin (KEFLEX) 500 MG capsule Take 1 capsule (500 mg total) by mouth 4 (four) times daily. 10/14/18   Palumbo, April, MD  cetirizine (ZYRTEC ALLERGY) 10 MG tablet Take 1 tablet (10 mg total) by mouth daily. 03/16/18   Law, Bea Graff, PA-C  cyclobenzaprine (FLEXERIL) 10 MG tablet Take 1 tablet (10 mg total) by mouth 2 (two) times daily as  needed for muscle spasms. 01/13/18   Langston Masker B, PA-C  HYDROcodone-homatropine (HYCODAN) 5-1.5 MG/5ML syrup Take 5 mLs by mouth every 6 (six) hours as needed for cough. 10/04/17   Eugenie Filler, MD  ibuprofen (ADVIL,MOTRIN) 200 MG tablet Take 600 mg by mouth every 6 (six) hours as needed for moderate pain.    [provider]  lisinopril (PRINIVIL,ZESTRIL) 10 MG tablet Take 10 mg by mouth daily.    [provider]  metFORMIN (GLUCOPHAGE) 500 MG tablet Take 1 tablet (500 mg total) by mouth 2 (two) times daily with a meal. 01/13/18   Langston Masker B, PA-C  metFORMIN (GLUCOPHAGE) 500 MG tablet Take 1 tablet (500 mg total) by mouth 2 (two) times daily with a meal. 10/14/18   Palumbo, April, MD  naproxen (NAPROSYN) 500 MG tablet Take 1 tablet (500 mg total) by mouth 2 (two) times daily. 03/16/18   Law, Bea Graff, PA-C  phenazopyridine (PYRIDIUM) 200 MG tablet Take 1 tablet (200 mg total) by mouth 3 (three) times daily. 03/16/18   Law, Bea Graff, PA-C  phenazopyridine (PYRIDIUM) 200 MG tablet Take 1 tablet (200 mg total) by mouth 3 (three) times daily. 10/14/18   Palumbo, April, MD    Allergies Bee venom   REVIEW OF SYSTEMS  Negative except as noted here or in the History of Present Illness.   PHYSICAL EXAMINATION  Initial Vital Signs Blood pressure 121/83, pulse 98, temperature 98.3 F (36.8 C), temperature source Oral, resp. rate 18, height 5' (1.524 m), SpO2 100 %.  Examination General: Well-developed, well-nourished female in no acute distress; appearance consistent with age of record HENT: normocephalic; atraumatic Eyes: pupils equal, round and reactive to light; extraocular muscles intact Neck: supple; nontender Heart: regular rate and rhythm Lungs: clear to auscultation bilaterally  Abdomen: soft; nondistended; nontender; bowel sounds present Back: Midline lumbar spinal tenderness; no thoracic spinal tenderness Extremities: No deformity; full range of  motion; pulses normal Neurologic: Awake, alert and oriented; motor function intact in all extremities and symmetric; no facial droop Skin: Warm and dry Psychiatric: Normal mood and affect   RESULTS  Summary of this visit's results, reviewed by myself:   EKG Interpretation  Date/Time:    Ventricular Rate:    PR Interval:    QRS Duration:   QT Interval:    QTC Calculation:   R Axis:     Text Interpretation:        Laboratory Studies: Results for orders placed or performed during the hospital encounter of 06/15/19 (from the past 24 hour(s))  Urinalysis, Routine w reflex microscopic     Status: Abnormal   Collection Time: 06/15/19  1:59 AM  Result Value Ref Range   Color, Urine YELLOW YELLOW   APPearance CLOUDY (A) CLEAR   Specific Gravity, Urine 1.020 1.005 - 1.030   pH 6.0 5.0 - 8.0   Glucose, UA NEGATIVE NEGATIVE mg/dL   Hgb urine dipstick NEGATIVE NEGATIVE   Bilirubin Urine NEGATIVE NEGATIVE   Ketones, ur NEGATIVE NEGATIVE mg/dL   Protein, ur NEGATIVE NEGATIVE mg/dL   Nitrite NEGATIVE NEGATIVE   Leukocytes,Ua NEGATIVE NEGATIVE  Pregnancy, urine     Status: None   Collection Time: 06/15/19  1:59 AM  Result Value Ref Range   Preg Test, Ur NEGATIVE NEGATIVE   Imaging Studies: Dg Lumbar Spine Complete  Result Date: 06/15/2019 CLINICAL DATA:  Fall with back pain EXAM: LUMBAR SPINE - COMPLETE 4+ VIEW COMPARISON:  None. FINDINGS: There is no evidence of lumbar spine fracture. Alignment is normal. Intervertebral disc spaces are maintained. Multilevel anterior osteophytosis. IMPRESSION: No fracture or listhesis of the lumbar spine. Electronically Signed   By: Ulyses Jarred M.D.   On: 06/15/2019 03:27    ED COURSE and MDM  Nursing notes and initial vitals signs, including pulse oximetry, reviewed.  Vitals:   06/15/19 0152 06/15/19 0153  BP: 121/83   Pulse: 98   Resp: 18   Temp: 98.3 F (36.8 C)   TempSrc: Oral   SpO2: 100%   Height:  5' (1.524 m)   No evidence of  fracture on plain films.  Patient takes tramadol on a regular basis but it is not adequate for her current pain.  We will place her on a short course of hydrocodone.  She is also requesting ibuprofen.  No evidence of urinary tract infection on urinalysis.  Urine sent for culture.  PROCEDURES    ED DIAGNOSES     ICD-10-CM   1. Fall from slip, trip, or stumble, initial encounter  W01.0XXA   2. Lower back injury, initial encounter  S39.92XA        Jaelani Posa, Jenny Reichmann, MD 06/15/19 [phone]

## 2019-06-15 NOTE — ED Notes (Signed)
Notified lab to add on upreg

## 2019-06-16 LAB — URINE CULTURE: Culture: NO GROWTH

## 2020-01-15 IMAGING — CR DG LUMBAR SPINE COMPLETE 4+V
5 series · 5 of 5 positions shown · non-contrast
Comparison: None.

CLINICAL DATA: Fall with back pain

EXAM:
LUMBAR SPINE - COMPLETE 4+ VIEW

[t l-spine a.p.]
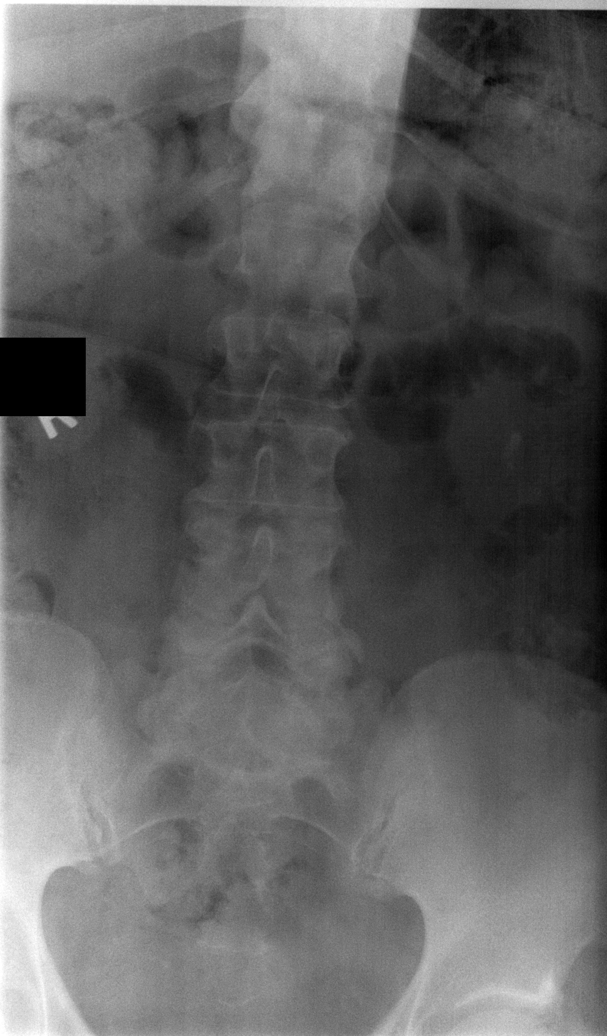

[t l-spine oblique exposure * (1 of 2)]
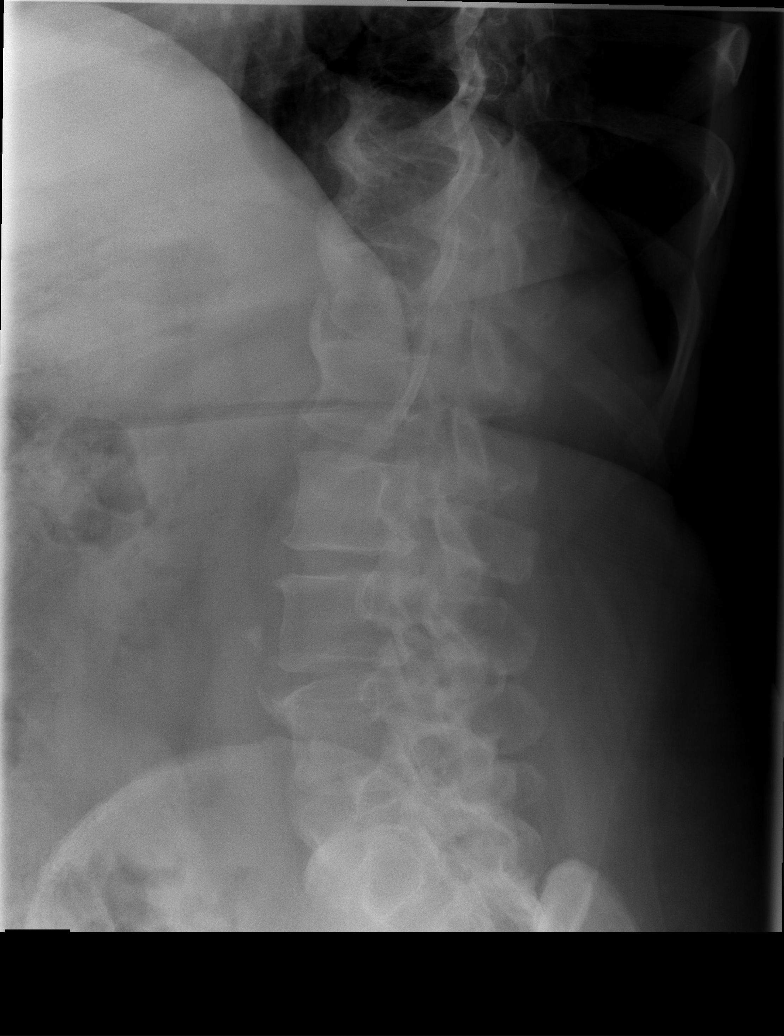

[t l-spine oblique exposure * (2 of 2)]
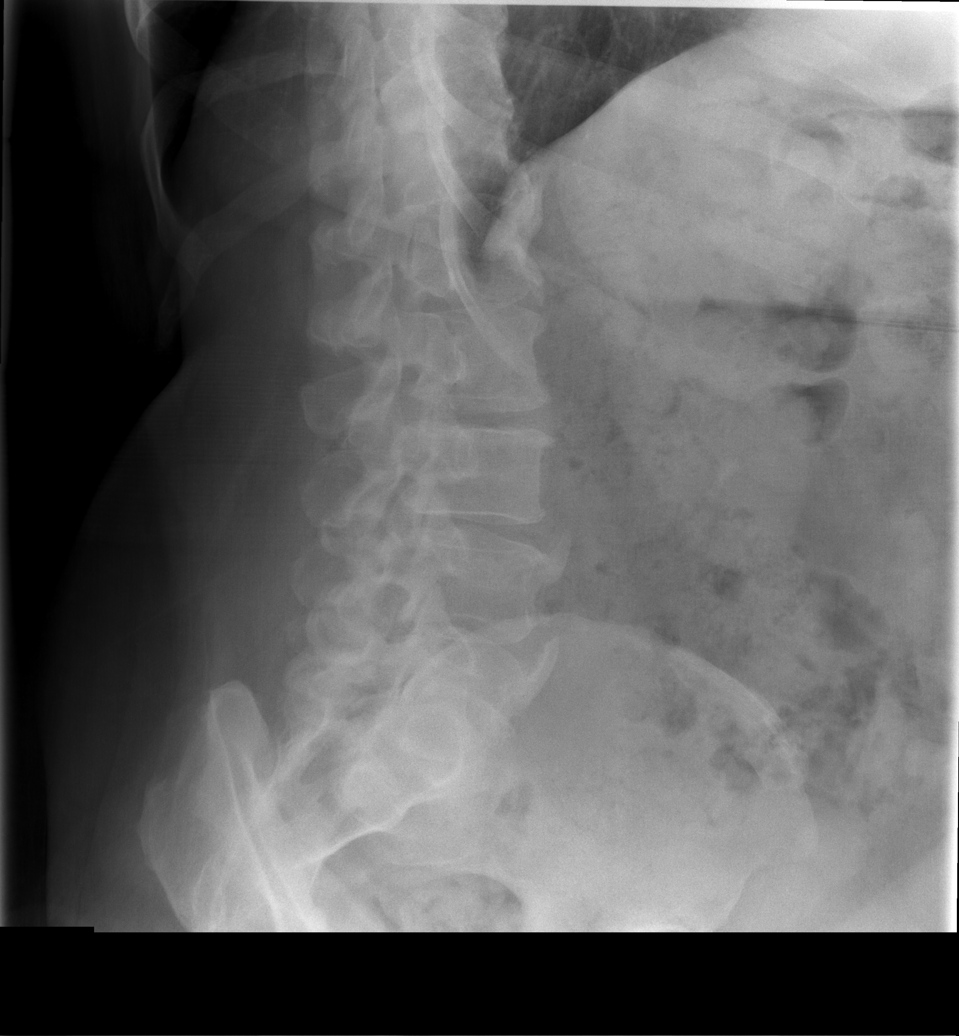

[t l-spine lat]
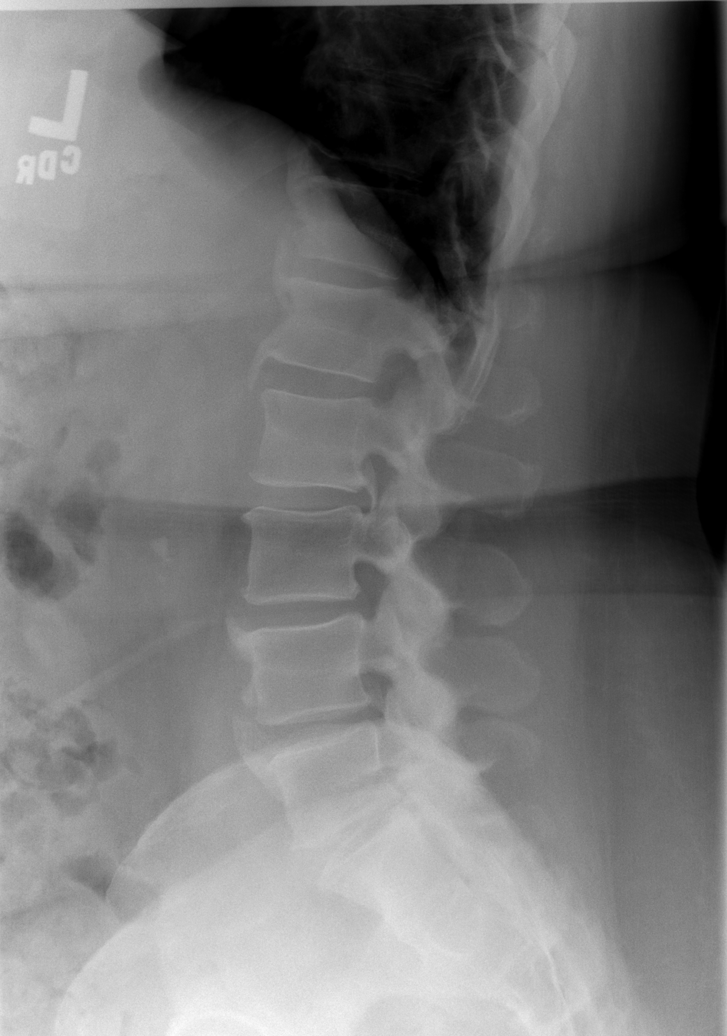

[t l-spine l5-s1 spot]
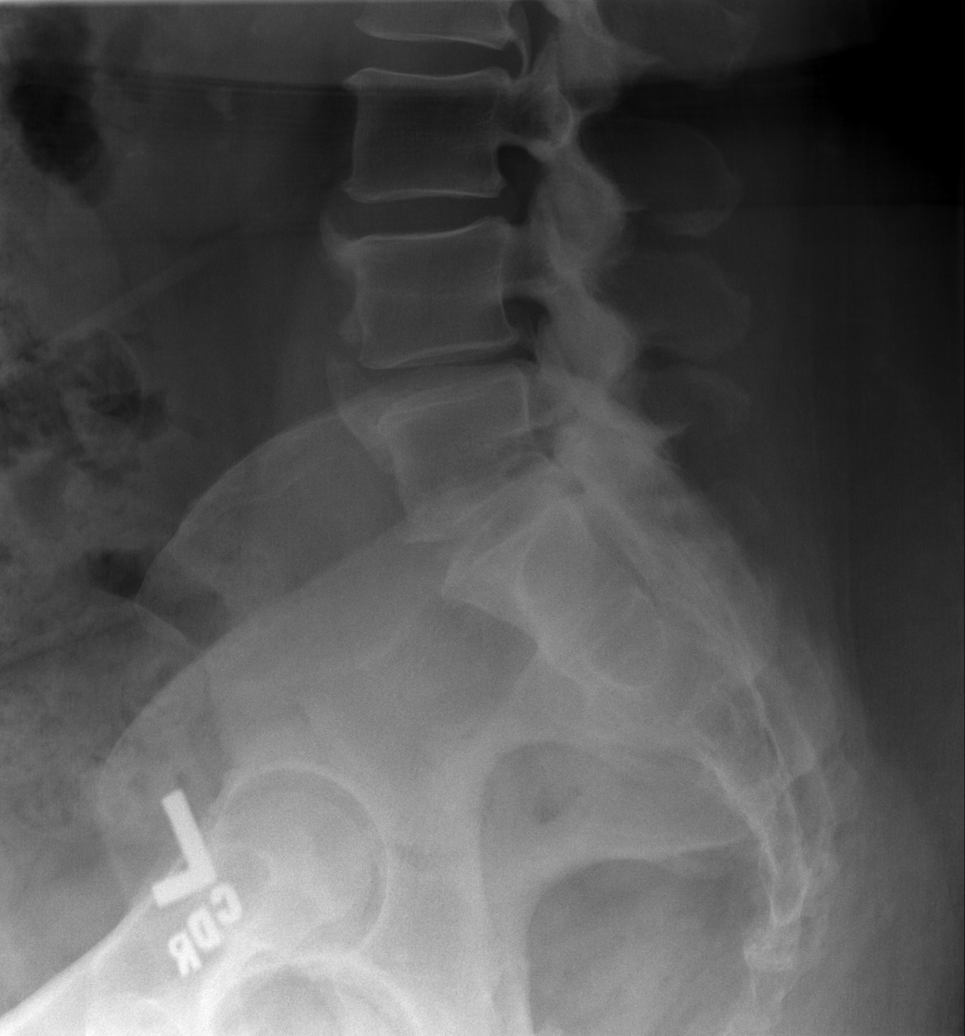

[5 of 5 positions shown; findings below may reference images not displayed]

FINDINGS: There is no evidence of lumbar spine fracture. Alignment is normal.
Intervertebral disc spaces are maintained. Multilevel anterior
osteophytosis.
IMPRESSION: No fracture or listhesis of the lumbar spine.

## 2020-05-06 ENCOUNTER — Other Ambulatory Visit: Payer: Self-pay

## 2020-05-06 ENCOUNTER — Emergency Department (HOSPITAL_BASED_OUTPATIENT_CLINIC_OR_DEPARTMENT_OTHER): Payer: Managed Care, Other (non HMO)

## 2020-05-06 ENCOUNTER — Encounter (HOSPITAL_BASED_OUTPATIENT_CLINIC_OR_DEPARTMENT_OTHER): Payer: Self-pay

## 2020-05-06 DIAGNOSIS — Z6841 Body Mass Index (BMI) 40.0 and over, adult: Secondary | ICD-10-CM

## 2020-05-06 DIAGNOSIS — E119 Type 2 diabetes mellitus without complications: Secondary | ICD-10-CM | POA: Diagnosis present

## 2020-05-06 DIAGNOSIS — E860 Dehydration: Secondary | ICD-10-CM | POA: Diagnosis present

## 2020-05-06 DIAGNOSIS — Z79899 Other long term (current) drug therapy: Secondary | ICD-10-CM

## 2020-05-06 DIAGNOSIS — K3533 Acute appendicitis with perforation and localized peritonitis, with abscess: Principal | ICD-10-CM | POA: Diagnosis present

## 2020-05-06 DIAGNOSIS — Z7984 Long term (current) use of oral hypoglycemic drugs: Secondary | ICD-10-CM

## 2020-05-06 DIAGNOSIS — I10 Essential (primary) hypertension: Secondary | ICD-10-CM | POA: Diagnosis present

## 2020-05-06 DIAGNOSIS — N179 Acute kidney failure, unspecified: Secondary | ICD-10-CM | POA: Diagnosis present

## 2020-05-06 DIAGNOSIS — Z8616 Personal history of COVID-19: Secondary | ICD-10-CM

## 2020-05-06 DIAGNOSIS — E282 Polycystic ovarian syndrome: Secondary | ICD-10-CM | POA: Diagnosis present

## 2020-05-06 DIAGNOSIS — G8929 Other chronic pain: Secondary | ICD-10-CM | POA: Diagnosis present

## 2020-05-06 LAB — CBC
HCT: 36.4 % (ref 36.0–46.0)
Hemoglobin: 12.4 g/dL (ref 12.0–15.0)
MCH: 31 pg (ref 26.0–34.0)
MCHC: 34.1 g/dL (ref 30.0–36.0)
MCV: 91 fL (ref 80.0–100.0)
Platelets: 232 10*3/uL (ref 150–400)
RBC: 4 MIL/uL (ref 3.87–5.11)
RDW: 11.8 % (ref 11.5–15.5)
WBC: 13.4 10*3/uL — ABNORMAL HIGH (ref 4.0–10.5)
nRBC: 0 % (ref 0.0–0.2)

## 2020-05-06 LAB — COMPREHENSIVE METABOLIC PANEL
ALT: 18 U/L (ref 0–44)
AST: 18 U/L (ref 15–41)
Albumin: 3.7 g/dL (ref 3.5–5.0)
Alkaline Phosphatase: 66 U/L (ref 38–126)
Anion gap: 12 (ref 5–15)
BUN: 18 mg/dL (ref 6–20)
CO2: 17 mmol/L — ABNORMAL LOW (ref 22–32)
Calcium: 9 mg/dL (ref 8.9–10.3)
Chloride: 105 mmol/L (ref 98–111)
Creatinine, Ser: 1.41 mg/dL — ABNORMAL HIGH (ref 0.44–1.00)
GFR calc Af Amer: 53 mL/min — ABNORMAL LOW (ref >60)
GFR calc non Af Amer: 46 mL/min — ABNORMAL LOW (ref >60)
Glucose, Bld: 94 mg/dL (ref 70–99)
Potassium: 4.6 mmol/L (ref 3.5–5.1)
Sodium: 134 mmol/L — ABNORMAL LOW (ref 135–145)
Total Bilirubin: 1 mg/dL (ref 0.3–1.2)
Total Protein: 7.3 g/dL (ref 6.5–8.1)

## 2020-05-06 LAB — PREGNANCY, URINE: Preg Test, Ur: NEGATIVE

## 2020-05-06 LAB — URINALYSIS, ROUTINE W REFLEX MICROSCOPIC
Bilirubin Urine: NEGATIVE
Glucose, UA: NEGATIVE mg/dL
Hgb urine dipstick: NEGATIVE
Ketones, ur: NEGATIVE mg/dL
Leukocytes,Ua: NEGATIVE
Nitrite: NEGATIVE
Protein, ur: NEGATIVE mg/dL
Specific Gravity, Urine: >1.03 — ABNORMAL HIGH (ref 1.005–1.030)
pH: 5.5 (ref 5.0–8.0)

## 2020-05-06 LAB — LIPASE, BLOOD: Lipase: 60 U/L — ABNORMAL HIGH (ref 11–51)

## 2020-05-06 MED ORDER — PIPERACILLIN-TAZOBACTAM 3.375 G IVPB
INTRAVENOUS | Status: AC
  Administered 2020-05-06: 3.375 g via INTRAVENOUS
  Filled 2020-05-06: qty 50

## 2020-05-06 MED ORDER — IOHEXOL 300 MG/ML  SOLN
100.0000 mL | Freq: Once | INTRAMUSCULAR | Status: AC | PRN
  Administered 2020-05-06: 100 mL via INTRAVENOUS

## 2020-05-06 MED ORDER — PIPERACILLIN-TAZOBACTAM 3.375 G IVPB 30 MIN
3.3750 g | Freq: Once | INTRAVENOUS | Status: AC
  Filled 2020-05-06: qty 50

## 2020-05-06 NOTE — ED Notes (Signed)
Patient transported to CT 

## 2020-05-06 NOTE — ED Triage Notes (Addendum)
Pt c/o RLQ pain x today since 5am-NAD-steady gait

## 2020-05-07 ENCOUNTER — Encounter (HOSPITAL_COMMUNITY): Admission: EM | Disposition: A | Discharge status: Home / Self Care | Payer: Self-pay | Source: Home / Self Care

## 2020-05-07 ENCOUNTER — Inpatient Hospital Stay (HOSPITAL_BASED_OUTPATIENT_CLINIC_OR_DEPARTMENT_OTHER)
Admission: EM | Admit: 2020-05-07 | Discharge: 2020-05-11 | DRG: 339 | Disposition: A | Discharge status: Home / Self Care | Payer: Managed Care, Other (non HMO) | Attending: General Surgery | Admitting: General Surgery

## 2020-05-07 ENCOUNTER — Encounter (HOSPITAL_COMMUNITY): Payer: Self-pay

## 2020-05-07 ENCOUNTER — Other Ambulatory Visit: Payer: Self-pay

## 2020-05-07 ENCOUNTER — Observation Stay (HOSPITAL_COMMUNITY): Payer: Managed Care, Other (non HMO) | Admitting: Anesthesiology

## 2020-05-07 DIAGNOSIS — Z79899 Other long term (current) drug therapy: Secondary | ICD-10-CM | POA: Diagnosis not present

## 2020-05-07 DIAGNOSIS — N179 Acute kidney failure, unspecified: Secondary | ICD-10-CM | POA: Diagnosis not present

## 2020-05-07 DIAGNOSIS — E282 Polycystic ovarian syndrome: Secondary | ICD-10-CM | POA: Diagnosis not present

## 2020-05-07 DIAGNOSIS — Z8616 Personal history of COVID-19: Secondary | ICD-10-CM | POA: Diagnosis not present

## 2020-05-07 DIAGNOSIS — Z7984 Long term (current) use of oral hypoglycemic drugs: Secondary | ICD-10-CM | POA: Diagnosis not present

## 2020-05-07 DIAGNOSIS — K3580 Unspecified acute appendicitis: Secondary | ICD-10-CM | POA: Diagnosis present

## 2020-05-07 DIAGNOSIS — E119 Type 2 diabetes mellitus without complications: Secondary | ICD-10-CM | POA: Diagnosis not present

## 2020-05-07 DIAGNOSIS — I10 Essential (primary) hypertension: Secondary | ICD-10-CM | POA: Diagnosis not present

## 2020-05-07 DIAGNOSIS — N200 Calculus of kidney: Secondary | ICD-10-CM

## 2020-05-07 DIAGNOSIS — Z6841 Body Mass Index (BMI) 40.0 and over, adult: Secondary | ICD-10-CM | POA: Diagnosis not present

## 2020-05-07 DIAGNOSIS — G8929 Other chronic pain: Secondary | ICD-10-CM | POA: Diagnosis not present

## 2020-05-07 DIAGNOSIS — E860 Dehydration: Secondary | ICD-10-CM | POA: Diagnosis not present

## 2020-05-07 DIAGNOSIS — K353 Acute appendicitis with localized peritonitis, without perforation or gangrene: Secondary | ICD-10-CM

## 2020-05-07 DIAGNOSIS — K3533 Acute appendicitis with perforation and localized peritonitis, with abscess: Secondary | ICD-10-CM | POA: Diagnosis present

## 2020-05-07 HISTORY — DX: Other intervertebral disc degeneration, lumbosacral region: M51.37

## 2020-05-07 HISTORY — DX: Other chronic pain: G89.29

## 2020-05-07 HISTORY — DX: Other intervertebral disc degeneration, lumbosacral region without mention of lumbar back pain or lower extremity pain: M51.379

## 2020-05-07 HISTORY — PX: LAPAROSCOPIC APPENDECTOMY: SHX408

## 2020-05-07 LAB — GLUCOSE, CAPILLARY
Glucose-Capillary: 93 mg/dL (ref 70–99)
Glucose-Capillary: 121 mg/dL — ABNORMAL HIGH (ref 70–99)

## 2020-05-07 LAB — SARS CORONAVIRUS 2 BY RT PCR (HOSPITAL ORDER, PERFORMED IN ~~LOC~~ HOSPITAL LAB): SARS Coronavirus 2: NEGATIVE

## 2020-05-07 LAB — HIV ANTIBODY (ROUTINE TESTING W REFLEX): HIV Screen 4th Generation wRfx: NONREACTIVE

## 2020-05-07 SURGERY — APPENDECTOMY, LAPAROSCOPIC
Anesthesia: General | Site: Abdomen

## 2020-05-07 MED ORDER — MIDAZOLAM HCL 2 MG/2ML IJ SOLN
INTRAMUSCULAR | Status: AC
  Filled 2020-05-07: qty 2

## 2020-05-07 MED ORDER — FENTANYL CITRATE (PF) 100 MCG/2ML IJ SOLN
INTRAMUSCULAR | Status: DC | PRN
  Administered 2020-05-07: 100 ug via INTRAVENOUS
  Administered 2020-05-07: 25 ug via INTRAVENOUS
  Administered 2020-05-07: 50 ug via INTRAVENOUS
  Administered 2020-05-07: 25 ug via INTRAVENOUS
  Administered 2020-05-07 (×2): 50 ug via INTRAVENOUS

## 2020-05-07 MED ORDER — HYDROMORPHONE HCL 1 MG/ML IJ SOLN
1.0000 mg | Freq: Once | INTRAMUSCULAR | Status: AC
  Filled 2020-05-07: qty 1

## 2020-05-07 MED ORDER — FENTANYL CITRATE (PF) 100 MCG/2ML IJ SOLN
INTRAMUSCULAR | Status: AC
  Filled 2020-05-07: qty 2

## 2020-05-07 MED ORDER — HYDROMORPHONE HCL 1 MG/ML IJ SOLN
INTRAMUSCULAR | Status: AC
  Filled 2020-05-07: qty 1

## 2020-05-07 MED ORDER — PROPOFOL 10 MG/ML IV BOLUS
INTRAVENOUS | Status: DC | PRN
  Administered 2020-05-07: 270 mg via INTRAVENOUS

## 2020-05-07 MED ORDER — ONDANSETRON HCL 4 MG/2ML IJ SOLN: 4.0000 mg | Freq: Four times a day (QID) | INTRAMUSCULAR | Status: DC | PRN

## 2020-05-07 MED ORDER — SCOPOLAMINE 1 MG/3DAYS TD PT72
1.0000 | MEDICATED_PATCH | TRANSDERMAL | Status: DC
  Administered 2020-05-07: 1.5 mg via TRANSDERMAL
  Filled 2020-05-07: qty 1

## 2020-05-07 MED ORDER — HYDROMORPHONE HCL 1 MG/ML IJ SOLN
INTRAMUSCULAR | Status: AC
  Administered 2020-05-07: 1 mg via INTRAVENOUS
  Filled 2020-05-07: qty 1

## 2020-05-07 MED ORDER — ROCURONIUM BROMIDE 10 MG/ML (PF) SYRINGE: PREFILLED_SYRINGE | INTRAVENOUS | Status: DC | PRN

## 2020-05-07 MED ORDER — DIPHENHYDRAMINE HCL 50 MG/ML IJ SOLN: 25.0000 mg | Freq: Four times a day (QID) | INTRAMUSCULAR | Status: DC | PRN

## 2020-05-07 MED ORDER — OXYCODONE HCL 5 MG PO TABS: 5.0000 mg | ORAL_TABLET | Freq: Once | ORAL | Status: DC | PRN

## 2020-05-07 MED ORDER — HYDROMORPHONE HCL 1 MG/ML IJ SOLN
1.0000 mg | INTRAMUSCULAR | Status: DC | PRN
  Administered 2020-05-07 – 2020-05-10 (×18): 1 mg via INTRAVENOUS
  Filled 2020-05-07 (×20): qty 1

## 2020-05-07 MED ORDER — MIDAZOLAM HCL 2 MG/2ML IJ SOLN: 1.0000 mg | INTRAMUSCULAR | Status: DC | PRN

## 2020-05-07 MED ORDER — PROPOFOL 10 MG/ML IV BOLUS
INTRAVENOUS | Status: AC
  Filled 2020-05-07: qty 40

## 2020-05-07 MED ORDER — DEXAMETHASONE SODIUM PHOSPHATE 10 MG/ML IJ SOLN
INTRAMUSCULAR | Status: AC
  Filled 2020-05-07: qty 1

## 2020-05-07 MED ORDER — HYDROMORPHONE HCL 1 MG/ML IJ SOLN
1.0000 mg | Freq: Once | INTRAMUSCULAR | Status: AC
  Administered 2020-05-07: 1 mg via INTRAVENOUS
  Filled 2020-05-07: qty 1

## 2020-05-07 MED ORDER — GABAPENTIN 300 MG PO CAPS
300.0000 mg | ORAL_CAPSULE | ORAL | Status: AC
  Administered 2020-05-07: 300 mg via ORAL
  Filled 2020-05-07: qty 1

## 2020-05-07 MED ORDER — KETOROLAC TROMETHAMINE 30 MG/ML IJ SOLN
INTRAMUSCULAR | Status: AC
  Filled 2020-05-07: qty 1

## 2020-05-07 MED ORDER — BUPIVACAINE-EPINEPHRINE (PF) 0.5% -1:200000 IJ SOLN
INTRAMUSCULAR | Status: AC
  Filled 2020-05-07: qty 30

## 2020-05-07 MED ORDER — SUCCINYLCHOLINE CHLORIDE 200 MG/10ML IV SOSY
PREFILLED_SYRINGE | INTRAVENOUS | Status: DC | PRN
  Administered 2020-05-07: 160 mg via INTRAVENOUS

## 2020-05-07 MED ORDER — DEXAMETHASONE SODIUM PHOSPHATE 10 MG/ML IJ SOLN
INTRAMUSCULAR | Status: DC | PRN
  Administered 2020-05-07: 10 mg via INTRAVENOUS

## 2020-05-07 MED ORDER — SUGAMMADEX SODIUM 500 MG/5ML IV SOLN
INTRAVENOUS | Status: AC
  Filled 2020-05-07: qty 5

## 2020-05-07 MED ORDER — LIDOCAINE 2% (20 MG/ML) 5 ML SYRINGE
INTRAMUSCULAR | Status: AC
  Filled 2020-05-07: qty 5

## 2020-05-07 MED ORDER — FENTANYL CITRATE (PF) 100 MCG/2ML IJ SOLN
25.0000 ug | INTRAMUSCULAR | Status: DC | PRN
  Administered 2020-05-07 (×3): 50 ug via INTRAVENOUS

## 2020-05-07 MED ORDER — ONDANSETRON HCL 4 MG/2ML IJ SOLN
INTRAMUSCULAR | Status: DC | PRN
  Administered 2020-05-07: 4 mg via INTRAVENOUS

## 2020-05-07 MED ORDER — OXYCODONE HCL 5 MG/5ML PO SOLN: 5.0000 mg | Freq: Once | ORAL | Status: DC | PRN

## 2020-05-07 MED ORDER — ONDANSETRON HCL 4 MG/2ML IJ SOLN
4.0000 mg | Freq: Once | INTRAMUSCULAR | Status: AC
  Administered 2020-05-07: 4 mg via INTRAVENOUS
  Filled 2020-05-07: qty 2

## 2020-05-07 MED ORDER — PROMETHAZINE HCL 25 MG/ML IJ SOLN: 6.2500 mg | INTRAMUSCULAR | Status: DC | PRN

## 2020-05-07 MED ORDER — HYDROMORPHONE HCL 1 MG/ML IJ SOLN
1.0000 mg | INTRAMUSCULAR | Status: DC | PRN
  Administered 2020-05-07 (×2): 1 mg via INTRAVENOUS
  Filled 2020-05-07: qty 1

## 2020-05-07 MED ORDER — FENTANYL CITRATE (PF) 100 MCG/2ML IJ SOLN
INTRAMUSCULAR | Status: AC
  Filled 2020-05-07: qty 4

## 2020-05-07 MED ORDER — POTASSIUM CHLORIDE IN NACL 20-0.9 MEQ/L-% IV SOLN
INTRAVENOUS | Status: DC
  Filled 2020-05-07 (×7): qty 1000

## 2020-05-07 MED ORDER — BUPIVACAINE-EPINEPHRINE 0.5% -1:200000 IJ SOLN
INTRAMUSCULAR | Status: DC | PRN
  Administered 2020-05-07: 25 mL

## 2020-05-07 MED ORDER — MIDAZOLAM HCL 5 MG/5ML IJ SOLN
INTRAMUSCULAR | Status: DC | PRN
  Administered 2020-05-07 (×2): 1 mg via INTRAVENOUS

## 2020-05-07 MED ORDER — ROCURONIUM BROMIDE 10 MG/ML (PF) SYRINGE
PREFILLED_SYRINGE | INTRAVENOUS | Status: DC | PRN
  Administered 2020-05-07: 10 mg via INTRAVENOUS
  Administered 2020-05-07: 40 mg via INTRAVENOUS

## 2020-05-07 MED ORDER — HYDROMORPHONE HCL 1 MG/ML IJ SOLN
0.2500 mg | INTRAMUSCULAR | Status: DC | PRN
  Administered 2020-05-07 (×3): 0.5 mg via INTRAVENOUS

## 2020-05-07 MED ORDER — ONDANSETRON HCL 4 MG/2ML IJ SOLN
INTRAMUSCULAR | Status: AC
  Filled 2020-05-07: qty 2

## 2020-05-07 MED ORDER — DIPHENHYDRAMINE HCL 25 MG PO CAPS: 25.0000 mg | ORAL_CAPSULE | Freq: Four times a day (QID) | ORAL | Status: DC | PRN

## 2020-05-07 MED ORDER — 0.9 % SODIUM CHLORIDE (POUR BTL) OPTIME
TOPICAL | Status: DC | PRN
  Administered 2020-05-07: 1000 mL

## 2020-05-07 MED ORDER — PHENYLEPHRINE 40 MCG/ML (10ML) SYRINGE FOR IV PUSH (FOR BLOOD PRESSURE SUPPORT)
PREFILLED_SYRINGE | INTRAVENOUS | Status: DC | PRN
  Administered 2020-05-07 (×2): 80 ug via INTRAVENOUS
  Administered 2020-05-07: 120 ug via INTRAVENOUS

## 2020-05-07 MED ORDER — SUGAMMADEX SODIUM 500 MG/5ML IV SOLN
INTRAVENOUS | Status: DC | PRN
Start: 2020-05-07 — End: 2020-05-07
  Administered 2020-05-07: 300 mg via INTRAVENOUS

## 2020-05-07 MED ORDER — KETOROLAC TROMETHAMINE 30 MG/ML IJ SOLN
30.0000 mg | Freq: Once | INTRAMUSCULAR | Status: AC | PRN
  Administered 2020-05-07: 30 mg via INTRAVENOUS

## 2020-05-07 MED ORDER — LIDOCAINE 2% (20 MG/ML) 5 ML SYRINGE
INTRAMUSCULAR | Status: DC | PRN
  Administered 2020-05-07: 100 mg via INTRAVENOUS

## 2020-05-07 MED ORDER — DEXMEDETOMIDINE HCL IN NACL 200 MCG/50ML IV SOLN
INTRAVENOUS | Status: DC | PRN
Start: 2020-05-07 — End: 2020-05-07
  Administered 2020-05-07: 8 ug via INTRAVENOUS

## 2020-05-07 MED ORDER — LACTATED RINGERS IV SOLN: INTRAVENOUS | Status: DC

## 2020-05-07 MED ORDER — PIPERACILLIN-TAZOBACTAM 3.375 G IVPB
3.3750 g | Freq: Three times a day (TID) | INTRAVENOUS | Status: DC
  Administered 2020-05-07 – 2020-05-11 (×12): 3.375 g via INTRAVENOUS
  Filled 2020-05-07 (×13): qty 50

## 2020-05-07 MED ORDER — SUCCINYLCHOLINE CHLORIDE 200 MG/10ML IV SOSY
PREFILLED_SYRINGE | INTRAVENOUS | Status: AC
  Filled 2020-05-07: qty 10

## 2020-05-07 MED ORDER — ROCURONIUM BROMIDE 10 MG/ML (PF) SYRINGE
PREFILLED_SYRINGE | INTRAVENOUS | Status: AC
  Filled 2020-05-07: qty 10

## 2020-05-07 MED ORDER — ONDANSETRON 4 MG PO TBDP: 4.0000 mg | ORAL_TABLET | Freq: Four times a day (QID) | ORAL | Status: DC | PRN

## 2020-05-07 SURGICAL SUPPLY — 55 items
ADH SKN CLS APL DERMABOND .7 (GAUZE/BANDAGES/DRESSINGS) ×1
APL PRP STRL LF DISP 70% ISPRP (MISCELLANEOUS) ×1
APPLIER CLIP 5 13 M/L LIGAMAX5 (MISCELLANEOUS)
APPLIER CLIP ROT 10 11.4 M/L (STAPLE)
APR CLP MED LRG 11.4X10 (STAPLE)
APR CLP MED LRG 5 ANG JAW (MISCELLANEOUS)
BAG SPEC RTRVL LRG 6X4 10 (ENDOMECHANICALS)
CABLE HIGH FREQUENCY MONO STRZ (ELECTRODE) ×3 IMPLANT
CHLORAPREP W/TINT 26 (MISCELLANEOUS) ×3 IMPLANT
CLIP APPLIE 5 13 M/L LIGAMAX5 (MISCELLANEOUS) IMPLANT
CLIP APPLIE ROT 10 11.4 M/L (STAPLE) IMPLANT
COVER WAND RF STERILE (DRAPES) IMPLANT
DECANTER SPIKE VIAL GLASS SM (MISCELLANEOUS) ×3 IMPLANT
DERMABOND ADVANCED (GAUZE/BANDAGES/DRESSINGS) ×2
DERMABOND ADVANCED .7 DNX12 (GAUZE/BANDAGES/DRESSINGS) ×1 IMPLANT
DRAIN CHANNEL 19F RND (DRAIN) ×2 IMPLANT
DRAPE LAPAROSCOPIC ABDOMINAL (DRAPES) ×2 IMPLANT
DRSG TEGADERM 4X4.75 (GAUZE/BANDAGES/DRESSINGS) ×2 IMPLANT
ELECT REM PT RETURN 15FT ADLT (MISCELLANEOUS) ×3 IMPLANT
EVACUATOR SILICONE 100CC (DRAIN) ×2 IMPLANT
GAUZE SPONGE 2X2 8PLY STRL LF (GAUZE/BANDAGES/DRESSINGS) IMPLANT
GLOVE BIO SURGEON STRL SZ 6.5 (GLOVE) ×2 IMPLANT
GLOVE BIO SURGEONS STRL SZ 6.5 (GLOVE) ×1
GLOVE BIOGEL PI IND STRL 7.0 (GLOVE) ×1 IMPLANT
GLOVE BIOGEL PI INDICATOR 7.0 (GLOVE) ×2
GOWN STRL REUS W/TWL XL LVL3 (GOWN DISPOSABLE) ×6 IMPLANT
GRASPER SUT TROCAR 14GX15 (MISCELLANEOUS) IMPLANT
HANDLE STAPLE EGIA 4 XL (STAPLE) IMPLANT
IRRIG SUCT STRYKERFLOW 2 WTIP (MISCELLANEOUS) ×3
IRRIGATION SUCT STRKRFLW 2 WTP (MISCELLANEOUS) ×1 IMPLANT
KIT BASIN OR (CUSTOM PROCEDURE TRAY) ×3 IMPLANT
KIT TURNOVER KIT A (KITS) IMPLANT
MARKER SKIN DUAL TIP RULER LAB (MISCELLANEOUS) IMPLANT
PENCIL SMOKE EVACUATOR (MISCELLANEOUS) IMPLANT
POUCH SPECIMEN RETRIEVAL 10MM (ENDOMECHANICALS) IMPLANT
RELOAD EGIA 45 MED/THCK PURPLE (STAPLE) IMPLANT
RELOAD EGIA 45 TAN VASC (STAPLE) IMPLANT
RELOAD EGIA 60 MED/THCK PURPLE (STAPLE) ×6 IMPLANT
RELOAD EGIA 60 TAN VASC (STAPLE) IMPLANT
RELOAD STAPLE 60 MED/THCK ART (STAPLE) IMPLANT
SCISSORS LAP 5X35 DISP (ENDOMECHANICALS) ×2 IMPLANT
SHEARS HARMONIC ACE PLUS 45CM (MISCELLANEOUS) ×2 IMPLANT
SLEEVE XCEL OPT CAN 5 100 (ENDOMECHANICALS) IMPLANT
SPONGE GAUZE 2X2 STER 10/PKG (GAUZE/BANDAGES/DRESSINGS) ×2
SUT ETHILON 2 0 PS N (SUTURE) ×2 IMPLANT
SUT VIC AB 2-0 SH 27 (SUTURE)
SUT VIC AB 2-0 SH 27X BRD (SUTURE) IMPLANT
SUT VIC AB 4-0 PS2 27 (SUTURE) ×3 IMPLANT
SUT VICRYL 0 UR6 27IN ABS (SUTURE) ×2 IMPLANT
TOWEL OR 17X26 10 PK STRL BLUE (TOWEL DISPOSABLE) ×3 IMPLANT
TRAY FOLEY MTR SLVR 14FR STAT (SET/KITS/TRAYS/PACK) ×2 IMPLANT
TRAY LAPAROSCOPIC (CUSTOM PROCEDURE TRAY) ×3 IMPLANT
TROCAR BLADELESS OPT 5 100 (ENDOMECHANICALS) IMPLANT
TROCAR BLADELESS OPT 5 150 (ENDOMECHANICALS) ×4 IMPLANT
TROCAR XCEL BLUNT TIP 100MML (ENDOMECHANICALS) ×3 IMPLANT

## 2020-05-07 NOTE — H&P (Signed)
[facility] Admission Note  [place] [birth year]  [id number].    Requesting MD: Shanon Rosser Chief Complaint: While right lower quadrant pain x4 hours. Reason for Consult: Acute appendicitis HPI:  Patient is a 44 year old female presented to the ED at [facility] yesterday complaining of right lower quadrant pain that began around 5 AM in the morning.  She reported gradual onset pain was 10/10 she presented with a low-grade fever in the ED and having chills last evening.  Work-up in the ED shows temperature of 100, heart rate 120, blood pressure 128/54.  Sats are good on room air.  WBC 13.4, H/H 12.4/36.4, platelets 232,000.  CMP shows sodium 134, CO2 17, creatinine 1.41, lipase 60, remainder the CMP is normal.  Pregnancy test is negative.  Covid is negative.  CT the abdomen shows the appendix is enlarged and inflamed.  The appendix measures approximately 2 cm in diameter. The appendix is located in the right lower quadrant posterior to the cecum there is no drainable fluid collection or abscess.  No evidence of perforation.  Is also nonobstructing bilateral renal calculi with, no hydronephrosis.  Patient is transferred from [facility] to [facility] long hospital for [place] to see and evaluate. Past medical history includes degenerative disc disease with chronic pain, diabetes, hypertension, polycystic ovarian syndrome.  ROS:  Review of Systems  Constitutional: Positive for fever and weight loss (she has lost about 100 lbs, down to 309 from 400 range).  HENT: Negative.   Eyes: Negative.   Respiratory: Negative.        She has had COVID last January and pneumonia in 2019  Cardiovascular: Negative.   Gastrointestinal: Positive for abdominal pain (RLQ), constipation (chronic), heartburn (occasional/food dependent) and nausea. Negative for blood in stool, diarrhea, melena and vomiting.       Has not had a colonoscopy  Genitourinary: Negative.   Musculoskeletal:  Positive for back pain (on chronic Norco for DDD).  Skin: Negative.   Neurological: Positive for headaches (frequent).  Endo/Heme/Allergies: Negative.   Psychiatric/Behavioral: Negative.     Family History  Problem Relation Age of Onset  . Diabetes Mother   . Heart failure Father   . Cancer Father   . Stroke Father     Past Medical History:  Diagnosis Date  . Chronic pain - Hydrocodone/acetaminophe 5/325 90 per month   . DDD (degenerative disc disease), lumbosacral   . Diabetes mellitus without complication ([place])   . Hypertension   . Polycystic ovarian syndrome     History reviewed. No pertinent surgical history.  Social History:  reports that she has never smoked. She has never used smokeless tobacco. She reports current alcohol use. She reports that she does not use drugs.  Allergies:  Allergies  Allergen Reactions  . Bee Venom Other (See Comments)    Shortness of breath/throat swells.    Prior to Admission medications   Medication Sig Start Date End Date Taking? Authorizing Provider  benzonatate (TESSALON) 100 MG capsule Take 1 capsule (100 mg total) by mouth every 8 (eight) hours. 10/26/18   Jacqlyn Larsen, PA-C  carvedilol (COREG) 6.25 MG tablet Take 1 tablet (6.25 mg total) by mouth 2 (two) times daily with a meal. 01/13/18   Langston Masker B, PA-C  cetirizine (ZYRTEC ALLERGY) 10 MG tablet Take 1 tablet (10 mg total) by mouth daily. 03/16/18   Law, Bea Graff, PA-C  HYDROcodone-acetaminophen (NORCO) 5-325 MG tablet Take 1 tablet by mouth every 4 (  four) hours as needed for severe pain. 06/15/19   Curatolo, Adam, DO  ibuprofen (ADVIL) 800 MG tablet Take 1 tablet (800 mg total) by mouth 3 (three) times daily as needed (for pain). 06/15/19   Curatolo, Adam, DO  lisinopril (PRINIVIL,ZESTRIL) 10 MG tablet Take 10 mg by mouth daily.    [provider]  metFORMIN (GLUCOPHAGE) 500 MG tablet Take 1 tablet (500 mg total) by mouth 2 (two) times daily with a meal. 10/14/18    Palumbo, April, MD     Blood pressure 103/66, pulse (!) 106, temperature 100.1 F (37.8 C), temperature source Oral, resp. rate 14, last menstrual period 04/27/2020, SpO2 98 %.BMI 60.3 Physical Exam:  General: pleasant, obese WD, WN white female who is laying in bed with ongoing mild discomfort HEENT: head is normocephalic, atraumatic.  Sclera are noninjected.  PERRL.  Ears and nose without any masses or lesions.  Mouth is pink and moist Heart: regular, rate, and rhythm.  Normal s1,s2. No obvious murmurs, gallops, or rubs noted.  Palpable radial and pedal pulses bilaterally Lungs: CTAB, no wheezes, rhonchi, or rales noted.  Respiratory effort nonlabored Abd: soft, tender RLQ, hurts to sit up or turn over. ND, no peritonitis,+ hypoactive BS, no masses, hernias, or organomegaly MS: all 4 extremities are symmetrical with no cyanosis, clubbing, or edema. Skin: warm and dry with no masses, lesions, or rashes Neuro: Cranial nerves 2-12 grossly intact, sensation is normal throughout Psych: A&Ox3 with an appropriate affect.   Results for orders placed or performed during the hospital encounter of 05/07/20 (from the past 48 hour(s))  Lipase, blood     Status: Abnormal   Collection Time: 05/06/20  9:46 PM  Result Value Ref Range   Lipase 60 (H) 11 - 51 U/L    Comment: Performed at [facility], [address] [place], [place] [address]  Comprehensive metabolic panel     Status: Abnormal   Collection Time: 05/06/20  9:46 PM  Result Value Ref Range   Sodium 134 (L) 135 - 145 mmol/L   Potassium 4.6 3.5 - 5.1 mmol/L   Chloride 105 98 - 111 mmol/L   CO2 17 (L) 22 - 32 mmol/L   Glucose, Bld 94 70 - 99 mg/dL    Comment: Glucose reference range applies only to samples taken after fasting for at least 8 hours.   BUN 18 6 - 20 mg/dL   Creatinine, Ser 1.41 (H) 0.44 - 1.00 mg/dL   Calcium 9.0 8.9 - 10.3 mg/dL   Total Protein 7.3 6.5 - 8.1 g/dL   Albumin 3.7 3.5 - 5.0 g/dL   AST 18 15 - 41  U/L   ALT 18 0 - 44 U/L   Alkaline Phosphatase 66 38 - 126 U/L   Total Bilirubin 1.0 0.3 - 1.2 mg/dL   GFR calc non Af Amer 46 (L) >60 mL/min   GFR calc Af Amer 53 (L) >60 mL/min   Anion gap 12 5 - 15    Comment: Performed at [facility], [address] [place], [place] [address]  CBC     Status: Abnormal   Collection Time: 05/06/20  9:46 PM  Result Value Ref Range   WBC 13.4 (H) 4.0 - 10.5 K/uL   RBC 4.00 3.87 - 5.11 MIL/uL   Hemoglobin 12.4 12.0 - 15.0 g/dL   HCT 36.4 36 - 46 %   MCV 91.0 80.0 - 100.0 fL   MCH 31.0 26.0 -  34.0 pg   MCHC 34.1 30.0 - 36.0 g/dL   RDW 11.8 11.5 - 15.5 %   Platelets 232 150 - 400 K/uL   nRBC 0.0 0.0 - 0.2 %    Comment: Performed at [facility], [address] [place], [place] [address]  Urinalysis, Routine w reflex microscopic Urine, Clean Catch     Status: Abnormal   Collection Time: 05/06/20  9:46 PM  Result Value Ref Range   Color, Urine YELLOW YELLOW   APPearance CLEAR CLEAR   Specific Gravity, Urine >1.030 (H) 1.005 - 1.030   pH 5.5 5.0 - 8.0   Glucose, UA NEGATIVE NEGATIVE mg/dL   Hgb urine dipstick NEGATIVE NEGATIVE   Bilirubin Urine NEGATIVE NEGATIVE   Ketones, ur NEGATIVE NEGATIVE mg/dL   Protein, ur NEGATIVE NEGATIVE mg/dL   Nitrite NEGATIVE NEGATIVE   Leukocytes,Ua NEGATIVE NEGATIVE    Comment: Microscopic not done on urines with negative protein, blood, leukocytes, nitrite, or glucose < 500 mg/dL. Performed at [facility], [address] [place], [place] [address]   Pregnancy, urine     Status: None   Collection Time: 05/06/20  9:46 PM  Result Value Ref Range   Preg Test, Ur NEGATIVE NEGATIVE    Comment:        THE SENSITIVITY OF THIS METHODOLOGY IS >20 mIU/mL. Performed at [facility], [address] [place], [place] [address]   SARS Coronavirus 2 by RT PCR (hospital order, performed in [facility] hospital lab) Nasopharyngeal Nasopharyngeal Swab     Status: None    Collection Time: 05/06/20 11:45 PM   Specimen: Nasopharyngeal Swab  Result Value Ref Range   SARS Coronavirus 2 NEGATIVE NEGATIVE    Comment: (NOTE) SARS-CoV-2 target nucleic acids are NOT DETECTED.  The SARS-CoV-2 RNA is generally detectable in upper and lower respiratory specimens during the acute phase of infection. The lowest concentration of SARS-CoV-2 viral copies this assay can detect is 250 copies / mL. A negative result does not preclude SARS-CoV-2 infection and should not be used as the sole basis for treatment or other patient management decisions.  A negative result may occur with improper specimen collection / handling, submission of specimen other than nasopharyngeal swab, presence of viral mutation(s) within the areas targeted by this assay, and inadequate number of viral copies (<250 copies / mL). A negative result must be combined with clinical observations, patient history, and epidemiological information.  Fact Sheet for Patients:   StrictlyIdeas.no  Fact Sheet for Healthcare Providers: BankingDealers.co.za  This test is not yet approved or  cleared by the [place] FDA and has been authorized for detection and/or diagnosis of SARS-CoV-2 by FDA under an Emergency Use Authorization (EUA).  This EUA will remain in effect (meaning this test can be used) for the duration of the COVID-19 declaration under Section 564(b)(1) of the Act, 21 U.S.C. section 360bbb-3(b)(1), unless the authorization is terminated or revoked sooner.  Performed at [facility], [address] [place], [place] [address]    CT ABDOMEN PELVIS W CONTRAST  Result Date: 05/06/2020 CLINICAL DATA:  44 year old female with abdominal pain. EXAM: CT ABDOMEN AND PELVIS WITH CONTRAST TECHNIQUE: Multidetector CT imaging of the abdomen and pelvis was performed using the standard protocol following bolus administration of intravenous contrast.  CONTRAST:  160mL OMNIPAQUE IOHEXOL 300 MG/ML  SOLN COMPARISON:  CT abdomen pelvis report dated 01/13/2018. The images are not available for direct comparison. FINDINGS:  Lower chest: The visualized lung bases are clear. No intra-abdominal free air or free fluid. Hepatobiliary: Probable mild fatty liver. No intrahepatic biliary ductal dilatation. The gallbladder is unremarkable. Pancreas: Unremarkable. No pancreatic ductal dilatation or surrounding inflammatory changes. Spleen: Normal in size without focal abnormality. Adrenals/Urinary Tract: The adrenal glands unremarkable. There are nonobstructing bilateral renal calculi. Several linear adjacent stones in the inferior pole of the left kidney measure approximately 15 mm in combined length. There is no hydronephrosis on either side. There is symmetric enhancement and excretion of contrast by both kidneys. The visualized ureters and urinary bladder appear unremarkable. Stomach/Bowel: The appendix is enlarged and inflamed. The appendix measures approximately 2 cm in diameter. The appendix is located in the right lower quadrant posterior to the cecum. There is no drainable fluid collection or abscess. No evidence of perforation. There is no bowel obstruction. Vascular/Lymphatic: The abdominal aorta and IVC unremarkable. No portal venous gas. There is no adenopathy. Reproductive: The uterus and ovaries are grossly unremarkable. Other: None Musculoskeletal: Degenerative changes of the spine. No acute osseous pathology. IMPRESSION: 1. Acute appendicitis. No abscess or perforation. 2. Nonobstructing bilateral renal calculi. No hydronephrosis. Electronically Signed   By: Anner Crete M.D.   On: 05/06/2020 23:05   Antibiotics Given (last 72 hours)    Date/Time Action Medication Dose Rate   05/06/20 2344 New Bag/Given   piperacillin-tazobactam (ZOSYN) IVPB 3.375 g 3.375 g 100 mL/hr        Assessment/Plan Chronic pain - Norco 5/325 #90 per month Degenerative  disc disease Hypertension Diabetes Polycystic ovarian syndrome Bilateral renal calculi without obstruction. AKI/dehydration   -Creatinine 1.41  Acute appendicitis  FEN: N.p.o. ID: Zosyn 8/12 >>day2 DVT: SCDs Follow-up: [facility]  Plan:  Admit to observation, hydrate, IV antibiotics, surgery later this AM.   Earnstine Regal [facility] 05/07/2020, 7:16 AM Please see Amion for pager number during day hours 7:00am-4:30pm

## 2020-05-07 NOTE — Transfer of Care (Signed)
Immediate Anesthesia Transfer of Care Note  Patient: Karen Gibson  Procedure(s) Performed: APPENDECTOMY LAPAROSCOPIC (N/A Abdomen)  Patient Location: PACU  Anesthesia Type:General  Level of Consciousness: awake, alert , oriented and patient cooperative  Airway & Oxygen Therapy: Patient Spontanous Breathing and Patient connected to face mask oxygen  Post-op Assessment: Report given to RN, Post -op Vital signs reviewed and stable and Patient moving all extremities  Post vital signs: Reviewed and stable  Last Vitals:  Vitals Value Taken Time  BP 114/71 05/07/20 1147  Temp    Pulse 95 05/07/20 1149  Resp 18 05/07/20 1149  SpO2 100 % 05/07/20 1149  Vitals shown include unvalidated device data.  Last Pain:  Vitals:   05/07/20 0900  TempSrc: Oral  PainSc:       Patients Stated Pain Goal: 0 (03/88/82 8003)  Complications: No complications documented.

## 2020-05-07 NOTE — ED Notes (Signed)
Pt arrives via care link from [facility] to see surgery for appendicitis. Pt sts pain began Wednesday morning at 0500 while at work. Pt sts receiving 3 doses of pain medication pta and still rates pain 10/10.

## 2020-05-07 NOTE — Plan of Care (Signed)

## 2020-05-07 NOTE — Anesthesia Procedure Notes (Signed)
Procedure Name: Intubation Date/Time: 05/07/2020 10:09 AM Performed by: Victoriano Lain, CRNA Pre-anesthesia Checklist: Patient identified, Emergency Drugs available, Suction available, Patient being monitored and Timeout performed Patient Re-evaluated:Patient Re-evaluated prior to induction Oxygen Delivery Method: Circle system utilized Induction Type: Cricoid Pressure applied, IV induction and Rapid sequence Laryngoscope Size: Mac and 4 Grade View: Grade I Tube type: Oral Tube size: 7.5 mm Number of attempts: 1 Airway Equipment and Method: Stylet Placement Confirmation: ETT inserted through vocal cords under direct vision,  positive ETCO2 and breath sounds checked- equal and bilateral Secured at: 21 cm Tube secured with: Tape Dental Injury: Teeth and Oropharynx as per pre-operative assessment

## 2020-05-07 NOTE — Op Note (Signed)
PRIYAH SCHMUCK [id number]   PRE-OPERATIVE DIAGNOSIS:  appendicitis  POST-OPERATIVE DIAGNOSIS:  appendicitis   Procedure(s): APPENDECTOMY LAPAROSCOPIC  Surgeon(s): Leighton Ruff, MD  ASSISTANT: none   ANESTHESIA:   local and general  EBL:   86ml  Delay start of Pharmacological VTE agent (>24hrs) due to surgical blood loss or risk of bleeding:  no  DRAINS: (42F) Jackson-Pratt drain(s) with closed bulb suction in the RLQ   SPECIMEN:  Source of Specimen:  appendix  DISPOSITION OF SPECIMEN:  PATHOLOGY  COUNTS:  YES  PLAN OF CARE: Admit for overnight observation  PATIENT DISPOSITION:  PACU - hemodynamically stable.   INDICATIONS: Patient with concerning symptoms & work up suspicious for appendicitis.  Surgery was recommended:  The anatomy & physiology of the digestive tract was discussed.  The pathophysiology of appendicitis was discussed.  Natural history risks without surgery was discussed.   I feel the risks of no intervention will lead to serious problems that outweigh the operative risks; therefore, I recommended diagnostic laparoscopy with removal of appendix to remove the pathology.  Laparoscopic & open techniques were discussed.   I noted a good likelihood this will help address the problem.    Risks such as bleeding, infection, abscess, leak, reoperation, possible ostomy, hernia, heart attack, death, and other risks were discussed.  Goals of post-operative recovery were discussed as well.  We will work to minimize complications.  Questions were answered.  The patient expresses understanding & wishes to proceed with surgery.  OR FINDINGS: gangrenous appendicitis with abscess  DESCRIPTION:   The patient was identified & brought into the operating room. The patient was positioned supine with left arm tucked. SCDs were active during the entire case. The patient underwent general anesthesia without any difficulty.  A foley catheter was inserted under sterile conditions.  The abdomen was prepped and draped in a sterile fashion. A Surgical Timeout confirmed our plan.   I made a vertical incision through the mid epigastrum.  I made a nick in the infraumbilical fascia and confirmed peritoneal entry.  I placed a stay suture and then the [facility] port.  We induced carbon dioxide insufflation.  Camera inspection revealed no injury.  I placed additional ports under direct laparoscopic visualization.  The cecum was densely adherent to the retroperitoneum.  I bluntly began to mobilize this away from the abdominal wall.  I mobilized the terminal ileum to proximal ascending colon in a lateral to medial fashion.  I took care to avoid injuring any retroperitoneal structures.  Purulence was identified in the right lower quadrant after mobilization.  This was aspirated.  The gallbladder was adherent to the cecum and bluntly mobilized away.  I divided the mesoappendix using a harmonic scalpel staying close to the edge of the appendiceal wall to avoid damaging any retroperitoneal structures.  I freed the appendix off its attachments to the ascending colon and cecal mesentery.  I elevated the appendix.  I was able to free off the base of the appendix, but this did not appear to be viable.  I decided to staple across the lateral edge of the cecum using 2 purple loads of a 60 mm Covidien stapler.  I took a healthy cuff viable cecum.  I placed the appendix inside an EndoCatch bag and removed out the [place] port.  I did copious irrigation. Hemostasis was good in the mesoappendix, colon mesentery, and retroperitoneum. Staple line was intact on the cecum with no bleeding. I washed out the pelvis, retrohepatic space and right paracolic  gutter.  Hemostasis is good. There was no perforation or injury that could be identified.  Because of the necrotic nature of the appendix, I decided to leave a 10 [place] JP drain in the right lower quadrant.  This was brought out through one of the laparoscopic port sites.   It was sutured into place with a 2-0 nylon suture.  I aspirated the carbon dioxide. I removed the ports. I closed the umbilical fascia site using a 0 Vicryl stitch. I closed skin using 4-0 vicryl stitch.  Sterile dressings were applied.  Patient was extubated and sent to the recovery room.  I discussed the operative findings with the patient's  family. I suspect the patient is going used in the hospital at least overnight and will need antibiotics for 7 days. Questions answered. They expressed understanding and appreciation.

## 2020-05-07 NOTE — ED Provider Notes (Signed)
Patient transferred from [facility] with right lower quadrant pain for the past 24 hours.  Found to have appendicitis on outside CT scan.  Complains of ongoing right-sided severe lower pain with guarding.  She received pain medication as well as IV antibiotics.  Arrival discussed with general surgery, Dr. Harlow Asa.   Ezequiel Essex, MD 05/07/20 [phone]

## 2020-05-07 NOTE — Anesthesia Preprocedure Evaluation (Signed)
Anesthesia Evaluation  Patient identified by MRN, date of birth, ID band Patient awake    Reviewed: Allergy & Precautions, NPO status , Patient's Chart, lab work & pertinent test results  Airway Mallampati: II  TM Distance: <3 FB Neck ROM: Full    Dental no notable dental hx. (+) Dental Advisory Given   Pulmonary neg pulmonary ROS,    Pulmonary exam normal breath sounds clear to auscultation       Cardiovascular hypertension, Normal cardiovascular exam Rhythm:Regular Rate:Normal     Neuro/Psych negative neurological ROS  negative psych ROS   GI/Hepatic negative GI ROS, Neg liver ROS,   Endo/Other  diabetesMorbid obesity  Renal/GU negative Renal ROS  negative genitourinary   Musculoskeletal negative musculoskeletal ROS (+)   Abdominal (+) + obese,   Peds negative pediatric ROS (+)  Hematology negative hematology ROS (+)   Anesthesia Other Findings   Reproductive/Obstetrics negative OB ROS                             Anesthesia Physical Anesthesia Plan  ASA: III  Anesthesia Plan: General   Post-op Pain Management:    Induction: Intravenous and Rapid sequence  PONV Risk Score and Plan: 3 and Ondansetron, Dexamethasone and Treatment may vary due to age or medical condition  Airway Management Planned: Oral ETT  Additional Equipment:   Intra-op Plan:   Post-operative Plan: Extubation in OR  Informed Consent: I have reviewed the patients History and Physical, chart, labs and discussed the procedure including the risks, benefits and alternatives for the proposed anesthesia with the patient or authorized representative who has indicated his/her understanding and acceptance.     Dental advisory given  Plan Discussed with: CRNA and Surgeon  Anesthesia Plan Comments:         Anesthesia Quick Evaluation

## 2020-05-07 NOTE — ED Provider Notes (Signed)
[place] DEPT [facility] Provider Note: Georgena Spurling, MD, FACEP  CSN: [id number] MRN: [id number] ARRIVAL: 05/06/20 at 2133 ROOM: [place]  Abdominal Pain   HISTORY OF PRESENT ILLNESS  05/07/20 12:17 AM Karen Gibson is a 44 y.o. female with right lower quadrant abdominal pain that began yesterday morning about 5 AM.  The onset was gradual.  She now rates the pain is a 10 out of 10, worse with movement or palpation.  She has had a low-grade fever in the ED and has had chills.  CT scan reveals an acute appendicitis and she was given Zosyn IV prior to my evaluation, but after conferring with the triage nurse.  She has had associated nausea but no vomiting or diarrhea.   Past Medical History:  Diagnosis Date  . Chronic pain   . DDD (degenerative disc disease), lumbosacral   . Diabetes mellitus without complication ([place])   . Hypertension   . Polycystic ovarian syndrome     History reviewed. No pertinent surgical history.  Family History  Problem Relation Age of Onset  . Diabetes Mother   . Heart failure Father   . Cancer Father   . Stroke Father     Social History   Tobacco Use  . Smoking status: Never Smoker  . Smokeless tobacco: Never Used  Vaping Use  . Vaping Use: Never used  Substance Use Topics  . Alcohol use: Yes    Comment: occ  . Drug use: No    Prior to Admission medications   Medication Sig Start Date End Date Taking? Authorizing Provider  benzonatate (TESSALON) 100 MG capsule Take 1 capsule (100 mg total) by mouth every 8 (eight) hours. 10/26/18   Jacqlyn Larsen, PA-C  carvedilol (COREG) 6.25 MG tablet Take 1 tablet (6.25 mg total) by mouth 2 (two) times daily with a meal. 01/13/18   Langston Masker B, PA-C  cetirizine (ZYRTEC ALLERGY) 10 MG tablet Take 1 tablet (10 mg total) by mouth daily. 03/16/18   Law, Bea Graff, PA-C  HYDROcodone-acetaminophen (NORCO) 5-325 MG tablet Take 1 tablet by mouth every 4 (four) hours as needed for severe  pain. 06/15/19   Curatolo, Adam, DO  ibuprofen (ADVIL) 800 MG tablet Take 1 tablet (800 mg total) by mouth 3 (three) times daily as needed (for pain). 06/15/19   Curatolo, Adam, DO  lisinopril (PRINIVIL,ZESTRIL) 10 MG tablet Take 10 mg by mouth daily.    [provider]  metFORMIN (GLUCOPHAGE) 500 MG tablet Take 1 tablet (500 mg total) by mouth 2 (two) times daily with a meal. 10/14/18   Palumbo, April, MD    Allergies Bee venom   REVIEW OF SYSTEMS  Negative except as noted here or in the History of Present Illness.   PHYSICAL EXAMINATION  Initial Vital Signs Blood pressure (!) 128/54, pulse (!) 120, temperature 100 F (37.8 C), temperature source Oral, resp. rate 20, last menstrual period 04/27/2020, SpO2 98 %.  Examination General: Well-developed, obese female in no acute distress; appearance consistent with age of record HENT: normocephalic; atraumatic Eyes: pupils equal, round and reactive to light; extraocular muscles intact Neck: supple Heart: regular rate and rhythm Lungs: clear to auscultation bilaterally Abdomen: soft; nondistended; right lower quadrant tenderness somewhat lateral to McBurney's point; bowel sounds present Extremities: No deformity; full range of motion; pulses normal Neurologic: Awake, alert and oriented; motor function intact in all extremities and symmetric; no facial droop Skin: Warm and dry Psychiatric: Normal mood and affect  RESULTS  Summary of this visit's results, reviewed and interpreted by myself:   EKG Interpretation  Date/Time:    Ventricular Rate:    PR Interval:    QRS Duration:   QT Interval:    QTC Calculation:   R Axis:     Text Interpretation:        Laboratory Studies: Results for orders placed or performed during the hospital encounter of 05/07/20 (from the past 24 hour(s))  Lipase, blood     Status: Abnormal   Collection Time: 05/06/20  9:46 PM  Result Value Ref Range   Lipase 60 (H) 11 - 51 U/L    Comprehensive metabolic panel     Status: Abnormal   Collection Time: 05/06/20  9:46 PM  Result Value Ref Range   Sodium 134 (L) 135 - 145 mmol/L   Potassium 4.6 3.5 - 5.1 mmol/L   Chloride 105 98 - 111 mmol/L   CO2 17 (L) 22 - 32 mmol/L   Glucose, Bld 94 70 - 99 mg/dL   BUN 18 6 - 20 mg/dL   Creatinine, Ser 1.41 (H) 0.44 - 1.00 mg/dL   Calcium 9.0 8.9 - 10.3 mg/dL   Total Protein 7.3 6.5 - 8.1 g/dL   Albumin 3.7 3.5 - 5.0 g/dL   AST 18 15 - 41 U/L   ALT 18 0 - 44 U/L   Alkaline Phosphatase 66 38 - 126 U/L   Total Bilirubin 1.0 0.3 - 1.2 mg/dL   GFR calc non Af Amer 46 (L) >60 mL/min   GFR calc Af Amer 53 (L) >60 mL/min   Anion gap 12 5 - 15  CBC     Status: Abnormal   Collection Time: 05/06/20  9:46 PM  Result Value Ref Range   WBC 13.4 (H) 4.0 - 10.5 K/uL   RBC 4.00 3.87 - 5.11 MIL/uL   Hemoglobin 12.4 12.0 - 15.0 g/dL   HCT 36.4 36 - 46 %   MCV 91.0 80.0 - 100.0 fL   MCH 31.0 26.0 - 34.0 pg   MCHC 34.1 30.0 - 36.0 g/dL   RDW 11.8 11.5 - 15.5 %   Platelets 232 150 - 400 K/uL   nRBC 0.0 0.0 - 0.2 %  Urinalysis, Routine w reflex microscopic Urine, Clean Catch     Status: Abnormal   Collection Time: 05/06/20  9:46 PM  Result Value Ref Range   Color, Urine YELLOW YELLOW   APPearance CLEAR CLEAR   Specific Gravity, Urine >1.030 (H) 1.005 - 1.030   pH 5.5 5.0 - 8.0   Glucose, UA NEGATIVE NEGATIVE mg/dL   Hgb urine dipstick NEGATIVE NEGATIVE   Bilirubin Urine NEGATIVE NEGATIVE   Ketones, ur NEGATIVE NEGATIVE mg/dL   Protein, ur NEGATIVE NEGATIVE mg/dL   Nitrite NEGATIVE NEGATIVE   Leukocytes,Ua NEGATIVE NEGATIVE  Pregnancy, urine     Status: None   Collection Time: 05/06/20  9:46 PM  Result Value Ref Range   Preg Test, Ur NEGATIVE NEGATIVE  SARS Coronavirus 2 by RT PCR (hospital order, performed in [place] hospital lab) Nasopharyngeal Nasopharyngeal Swab     Status: None   Collection Time: 05/06/20 11:45 PM   Specimen: Nasopharyngeal Swab  Result Value Ref Range    SARS Coronavirus 2 NEGATIVE NEGATIVE   Imaging Studies: CT ABDOMEN PELVIS W CONTRAST  Result Date: 05/06/2020 CLINICAL DATA:  44 year old female with abdominal pain. EXAM: CT ABDOMEN AND PELVIS WITH CONTRAST TECHNIQUE: Multidetector CT imaging of the abdomen and pelvis  was performed using the standard protocol following bolus administration of intravenous contrast. CONTRAST:  155mL OMNIPAQUE IOHEXOL 300 MG/ML  SOLN COMPARISON:  CT abdomen pelvis report dated 01/13/2018. The images are not available for direct comparison. FINDINGS: Lower chest: The visualized lung bases are clear. No intra-abdominal free air or free fluid. Hepatobiliary: Probable mild fatty liver. No intrahepatic biliary ductal dilatation. The gallbladder is unremarkable. Pancreas: Unremarkable. No pancreatic ductal dilatation or surrounding inflammatory changes. Spleen: Normal in size without focal abnormality. Adrenals/Urinary Tract: The adrenal glands unremarkable. There are nonobstructing bilateral renal calculi. Several linear adjacent stones in the inferior pole of the left kidney measure approximately 15 mm in combined length. There is no hydronephrosis on either side. There is symmetric enhancement and excretion of contrast by both kidneys. The visualized ureters and urinary bladder appear unremarkable. Stomach/Bowel: The appendix is enlarged and inflamed. The appendix measures approximately 2 cm in diameter. The appendix is located in the right lower quadrant posterior to the cecum. There is no drainable fluid collection or abscess. No evidence of perforation. There is no bowel obstruction. Vascular/Lymphatic: The abdominal aorta and IVC unremarkable. No portal venous gas. There is no adenopathy. Reproductive: The uterus and ovaries are grossly unremarkable. Other: None Musculoskeletal: Degenerative changes of the spine. No acute osseous pathology. IMPRESSION: 1. Acute appendicitis. No abscess or perforation. 2. Nonobstructing  bilateral renal calculi. No hydronephrosis. Electronically Signed   By: Anner Crete M.D.   On: 05/06/2020 23:05    ED COURSE and MDM  Nursing notes, initial and subsequent vitals signs, including pulse oximetry, reviewed and interpreted by myself.  Vitals:   05/06/20 2147 05/07/20 0019  BP: (!) 128/54 120/74  Pulse: (!) 120 (!) 125  Resp: 20 16  Temp: 100 F (37.8 C)   TempSrc: Oral   SpO2: 98% 98%   Medications  iohexol (OMNIPAQUE) 300 MG/ML solution 100 mL (100 mLs Intravenous Contrast Given 05/06/20 2232)  piperacillin-tazobactam (ZOSYN) IVPB 3.375 g (0 g Intravenous Stopped 05/07/20 0018)  ondansetron (ZOFRAN) injection 4 mg (4 mg Intravenous Given 05/07/20 0025)  HYDROmorphone (DILAUDID) injection 1 mg (1 mg Intravenous Given 05/07/20 0022)   5:04 AM Dr. Harlow Asa will see the patient in the [facility] long ED. Charlann Lange, PA-C, made aware of transfer.   PROCEDURES  Procedures   ED DIAGNOSES     ICD-10-CM   1. Acute appendicitis with localized peritonitis, without perforation, abscess, or gangrene  K35.30   2. Nephrolithiasis  N20.0        Karmon Andis, Jenny Reichmann, MD 05/07/20 [phone]

## 2020-05-07 NOTE — Anesthesia Postprocedure Evaluation (Signed)
Anesthesia Post Note  Patient: Karen Gibson  Procedure(s) Performed: APPENDECTOMY LAPAROSCOPIC (N/A Abdomen)     Patient location during evaluation: PACU Anesthesia Type: General Level of consciousness: awake and alert Pain management: pain level controlled Vital Signs Assessment: post-procedure vital signs reviewed and stable Respiratory status: spontaneous breathing, nonlabored ventilation, respiratory function stable and patient connected to nasal cannula oxygen Cardiovascular status: blood pressure returned to baseline and stable Postop Assessment: no apparent nausea or vomiting Anesthetic complications: no   No complications documented.  Last Vitals:  Vitals:   05/07/20 0900 05/07/20 1147  BP: 121/72 114/71  Pulse: (!) 110 96  Resp: 16 (!) 21  Temp: 37.7 C 37.3 C  SpO2: 99% 100%    Last Pain:  Vitals:   05/07/20 1200  TempSrc:   PainSc: 10-Worst pain ever                 Lorance Pickeral S

## 2020-05-07 NOTE — ED Notes (Signed)
[facility] at bedside 

## 2020-05-08 ENCOUNTER — Encounter (HOSPITAL_COMMUNITY): Payer: Self-pay | Admitting: General Surgery

## 2020-05-08 LAB — CBC
HCT: 31.9 % — ABNORMAL LOW (ref 36.0–46.0)
Hemoglobin: 11.1 g/dL — ABNORMAL LOW (ref 12.0–15.0)
MCH: 31.5 pg (ref 26.0–34.0)
MCHC: 34.8 g/dL (ref 30.0–36.0)
MCV: 90.6 fL (ref 80.0–100.0)
Platelets: 199 10*3/uL (ref 150–400)
RBC: 3.52 MIL/uL — ABNORMAL LOW (ref 3.87–5.11)
RDW: 11.7 % (ref 11.5–15.5)
WBC: 17 10*3/uL — ABNORMAL HIGH (ref 4.0–10.5)
nRBC: 0 % (ref 0.0–0.2)

## 2020-05-08 LAB — BASIC METABOLIC PANEL
Anion gap: 10 (ref 5–15)
BUN: 18 mg/dL (ref 6–20)
CO2: 17 mmol/L — ABNORMAL LOW (ref 22–32)
Calcium: 8.2 mg/dL — ABNORMAL LOW (ref 8.9–10.3)
Chloride: 100 mmol/L (ref 98–111)
Creatinine, Ser: 1.29 mg/dL — ABNORMAL HIGH (ref 0.44–1.00)
GFR calc Af Amer: 59 mL/min — ABNORMAL LOW (ref >60)
GFR calc non Af Amer: 51 mL/min — ABNORMAL LOW (ref >60)
Glucose, Bld: 198 mg/dL — ABNORMAL HIGH (ref 70–99)
Potassium: 4.4 mmol/L (ref 3.5–5.1)
Sodium: 127 mmol/L — ABNORMAL LOW (ref 135–145)

## 2020-05-08 NOTE — Progress Notes (Signed)
1 Day Post-Op   Subjective/Chief Complaint: Less pain today Tolerating clears hungry   Objective: Vital signs in last 24 hours: Temp:  [98.5 F (36.9 C)-99.8 F (37.7 C)] 98.6 F (37 C) (08/14 0517) Pulse Rate:  [79-110] 84 (08/14 0517) Resp:  [14-25] 14 (08/14 0517) BP: (100-140)/(60-89) 109/65 (08/14 0517) SpO2:  [98 %-100 %] 98 % (08/14 0517) Weight:  [140.2 kg] 140.2 kg (08/13 1429) Last BM Date: 05/06/20  Intake/Output from previous day: 08/13 0701 - 08/14 0700 In: 2896.5 [P.O.:480; I.V.:2316; IV Piggyback:100.5] Out: 1440 [Urine:1250; Drains:165; Blood:25] Intake/Output this shift: Total I/O In: -  Out: 300 [Urine:300]  Exam: Awake and alert Abdomen soft, drain serosang  Lab Results:  Recent Labs    05/06/20 2146 05/08/20 0302  WBC 13.4* 17.0*  HGB 12.4 11.1*  HCT 36.4 31.9*  PLT 232 199   BMET Recent Labs    05/06/20 2146 05/08/20 0302  NA 134* 127*  K 4.6 4.4  CL 105 100  CO2 17* 17*  GLUCOSE 94 198*  BUN 18 18  CREATININE 1.41* 1.29*  CALCIUM 9.0 8.2*   PT/INR No results for input(s): LABPROT, INR in the last 72 hours. ABG No results for input(s): PHART, HCO3 in the last 72 hours.  Invalid input(s): PCO2, PO2  Studies/Results: CT ABDOMEN PELVIS W CONTRAST  Result Date: 05/06/2020 CLINICAL DATA:  44 year old female with abdominal pain. EXAM: CT ABDOMEN AND PELVIS WITH CONTRAST TECHNIQUE: Multidetector CT imaging of the abdomen and pelvis was performed using the standard protocol following bolus administration of intravenous contrast. CONTRAST:  168mL OMNIPAQUE IOHEXOL 300 MG/ML  SOLN COMPARISON:  CT abdomen pelvis report dated 01/13/2018. The images are not available for direct comparison. FINDINGS: Lower chest: The visualized lung bases are clear. No intra-abdominal free air or free fluid. Hepatobiliary: Probable mild fatty liver. No intrahepatic biliary ductal dilatation. The gallbladder is unremarkable. Pancreas: Unremarkable. No  pancreatic ductal dilatation or surrounding inflammatory changes. Spleen: Normal in size without focal abnormality. Adrenals/Urinary Tract: The adrenal glands unremarkable. There are nonobstructing bilateral renal calculi. Several linear adjacent stones in the inferior pole of the left kidney measure approximately 15 mm in combined length. There is no hydronephrosis on either side. There is symmetric enhancement and excretion of contrast by both kidneys. The visualized ureters and urinary bladder appear unremarkable. Stomach/Bowel: The appendix is enlarged and inflamed. The appendix measures approximately 2 cm in diameter. The appendix is located in the right lower quadrant posterior to the cecum. There is no drainable fluid collection or abscess. No evidence of perforation. There is no bowel obstruction. Vascular/Lymphatic: The abdominal aorta and IVC unremarkable. No portal venous gas. There is no adenopathy. Reproductive: The uterus and ovaries are grossly unremarkable. Other: None Musculoskeletal: Degenerative changes of the spine. No acute osseous pathology. IMPRESSION: 1. Acute appendicitis. No abscess or perforation. 2. Nonobstructing bilateral renal calculi. No hydronephrosis. Electronically Signed   By: Anner Crete M.D.   On: 05/06/2020 23:05    Anti-infectives: Anti-infectives (From admission, onward)   Start     Dose/Rate Route Frequency Ordered Stop   05/07/20 1000  piperacillin-tazobactam (ZOSYN) IVPB 3.375 g     Discontinue    Note to Pharmacy: Can you manage this her creatinine is up some.  She had a dose at [facility]   3.375 g 12.5 mL/hr over 240 Minutes Intravenous Every 8 hours 05/07/20 0819     05/06/20 2345  piperacillin-tazobactam (ZOSYN) IVPB 3.375 g        3.375 g  100 mL/hr over 30 Minutes Intravenous  Once 05/06/20 2338 05/07/20 0018      Assessment/Plan: s/p Procedure(s): APPENDECTOMY LAPAROSCOPIC (N/A)  Gangrenous appendicitis  Continue IV antibiotics Repeat CBC in  the morning Full liquid diet  LOS: 0 days    Coralie Keens 05/08/2020

## 2020-05-09 DIAGNOSIS — E860 Dehydration: Secondary | ICD-10-CM | POA: Diagnosis present

## 2020-05-09 DIAGNOSIS — E282 Polycystic ovarian syndrome: Secondary | ICD-10-CM | POA: Diagnosis present

## 2020-05-09 DIAGNOSIS — I10 Essential (primary) hypertension: Secondary | ICD-10-CM | POA: Diagnosis present

## 2020-05-09 DIAGNOSIS — Z6841 Body Mass Index (BMI) 40.0 and over, adult: Secondary | ICD-10-CM | POA: Diagnosis not present

## 2020-05-09 DIAGNOSIS — G8929 Other chronic pain: Secondary | ICD-10-CM | POA: Diagnosis present

## 2020-05-09 DIAGNOSIS — K3533 Acute appendicitis with perforation and localized peritonitis, with abscess: Secondary | ICD-10-CM | POA: Diagnosis present

## 2020-05-09 DIAGNOSIS — Z7984 Long term (current) use of oral hypoglycemic drugs: Secondary | ICD-10-CM | POA: Diagnosis not present

## 2020-05-09 DIAGNOSIS — Z8616 Personal history of COVID-19: Secondary | ICD-10-CM | POA: Diagnosis not present

## 2020-05-09 DIAGNOSIS — Z79899 Other long term (current) drug therapy: Secondary | ICD-10-CM | POA: Diagnosis not present

## 2020-05-09 DIAGNOSIS — N179 Acute kidney failure, unspecified: Secondary | ICD-10-CM | POA: Diagnosis present

## 2020-05-09 DIAGNOSIS — E119 Type 2 diabetes mellitus without complications: Secondary | ICD-10-CM | POA: Diagnosis present

## 2020-05-09 LAB — CBC
HCT: 28.9 % — ABNORMAL LOW (ref 36.0–46.0)
Hemoglobin: 9.9 g/dL — ABNORMAL LOW (ref 12.0–15.0)
MCH: 31.4 pg (ref 26.0–34.0)
MCHC: 34.3 g/dL (ref 30.0–36.0)
MCV: 91.7 fL (ref 80.0–100.0)
Platelets: 178 10*3/uL (ref 150–400)
RBC: 3.15 MIL/uL — ABNORMAL LOW (ref 3.87–5.11)
RDW: 12 % (ref 11.5–15.5)
WBC: 9.8 10*3/uL (ref 4.0–10.5)
nRBC: 0 % (ref 0.0–0.2)

## 2020-05-09 MED ORDER — MENTHOL 3 MG MT LOZG
1.0000 | LOZENGE | OROMUCOSAL | Status: DC | PRN
  Administered 2020-05-09: 3 mg via ORAL
  Filled 2020-05-09: qty 9

## 2020-05-09 NOTE — Plan of Care (Signed)
Continuing to monitor and help patient reduce anxiety related to being in the hospital. Deanna Artis Rn

## 2020-05-09 NOTE — Progress Notes (Signed)
2 Days Post-Op   Subjective/Chief Complaint: Till with RLQ pain Denies nausea   Objective: Vital signs in last 24 hours: Temp:  [98.6 F (37 C)-98.7 F (37.1 C)] 98.6 F (37 C) (08/15 0554) Pulse Rate:  [78-101] 78 (08/15 0554) Resp:  [16-20] 17 (08/15 0554) BP: (101-110)/(61-66) 101/66 (08/15 0554) SpO2:  [96 %-100 %] 97 % (08/15 0554) Last BM Date: 05/06/20  Intake/Output from previous day: 08/14 0701 - 08/15 0700 In: 3267.1 [P.O.:1020; I.V.:2144.8; IV Piggyback:102.3] Out: 2180 [Urine:2150; Drains:30] Intake/Output this shift: No intake/output data recorded.  Exam: Awake and alert Abdomen obese, moderately tender in the RLQ. Drain serosang Lab Results:  Recent Labs    05/08/20 0302 05/09/20 0325  WBC 17.0* 9.8  HGB 11.1* 9.9*  HCT 31.9* 28.9*  PLT 199 178   BMET Recent Labs    05/06/20 2146 05/08/20 0302  NA 134* 127*  K 4.6 4.4  CL 105 100  CO2 17* 17*  GLUCOSE 94 198*  BUN 18 18  CREATININE 1.41* 1.29*  CALCIUM 9.0 8.2*   PT/INR No results for input(s): LABPROT, INR in the last 72 hours. ABG No results for input(s): PHART, HCO3 in the last 72 hours.  Invalid input(s): PCO2, PO2  Studies/Results: No results found.  Anti-infectives: Anti-infectives (From admission, onward)   Start     Dose/Rate Route Frequency Ordered Stop   05/07/20 1000  piperacillin-tazobactam (ZOSYN) IVPB 3.375 g     Discontinue    Note to Pharmacy: Can you manage this her creatinine is up some.  She had a dose at [facility]   3.375 g 12.5 mL/hr over 240 Minutes Intravenous Every 8 hours 05/07/20 0819     05/06/20 2345  piperacillin-tazobactam (ZOSYN) IVPB 3.375 g        3.375 g 100 mL/hr over 30 Minutes Intravenous  Once 05/06/20 2338 05/07/20 0018      Assessment/Plan: s/p Procedure(s): APPENDECTOMY LAPAROSCOPIC (N/A)  Gangrenous appendicitis  Needs continued IV antibiotics.  WBC normal today Given her pain and tenderness, will not advance diet past full liquids   LOS: 0 days    Coralie Keens 05/09/2020

## 2020-05-10 LAB — SURGICAL PATHOLOGY

## 2020-05-10 MED ORDER — ACETAMINOPHEN 325 MG PO TABS: 650.0000 mg | ORAL_TABLET | Freq: Three times a day (TID) | ORAL | Status: DC

## 2020-05-10 MED ORDER — TIZANIDINE HCL 2 MG PO TABS
4.0000 mg | ORAL_TABLET | Freq: Two times a day (BID) | ORAL | Status: DC | PRN
  Administered 2020-05-10: 4 mg via ORAL
  Filled 2020-05-10 (×3): qty 2

## 2020-05-10 MED ORDER — LISINOPRIL 20 MG PO TABS
20.0000 mg | ORAL_TABLET | Freq: Every day | ORAL | Status: DC
  Administered 2020-05-10 – 2020-05-11 (×2): 20 mg via ORAL
  Filled 2020-05-10 (×2): qty 1

## 2020-05-10 MED ORDER — METFORMIN HCL 500 MG PO TABS
500.0000 mg | ORAL_TABLET | Freq: Two times a day (BID) | ORAL | Status: DC
  Administered 2020-05-10 – 2020-05-11 (×2): 500 mg via ORAL
  Filled 2020-05-10 (×2): qty 1

## 2020-05-10 MED ORDER — ONDANSETRON HCL 4 MG PO TABS: 4.0000 mg | ORAL_TABLET | Freq: Three times a day (TID) | ORAL | Status: DC | PRN

## 2020-05-10 MED ORDER — BUPROPION HCL ER (XL) 300 MG PO TB24
300.0000 mg | ORAL_TABLET | Freq: Every day | ORAL | Status: DC
  Administered 2020-05-10 – 2020-05-11 (×2): 300 mg via ORAL
  Filled 2020-05-10 (×2): qty 1

## 2020-05-10 MED ORDER — HYDROCODONE-ACETAMINOPHEN 5-325 MG PO TABS
1.0000 | ORAL_TABLET | ORAL | Status: DC | PRN
  Administered 2020-05-10 – 2020-05-11 (×4): 1 via ORAL
  Filled 2020-05-10 (×4): qty 1

## 2020-05-10 MED ORDER — LISINOPRIL 20 MG PO TABS: 40.0000 mg | ORAL_TABLET | Freq: Two times a day (BID) | ORAL | Status: DC

## 2020-05-10 MED ORDER — HYDROMORPHONE HCL 1 MG/ML IJ SOLN
1.0000 mg | INTRAMUSCULAR | Status: DC | PRN
  Administered 2020-05-10: 1 mg via INTRAVENOUS
  Filled 2020-05-10: qty 1

## 2020-05-10 MED ORDER — ACETAMINOPHEN 325 MG PO TABS
650.0000 mg | ORAL_TABLET | Freq: Three times a day (TID) | ORAL | Status: DC
  Administered 2020-05-10 – 2020-05-11 (×2): 650 mg via ORAL
  Filled 2020-05-10 (×3): qty 2

## 2020-05-10 MED ORDER — ENOXAPARIN SODIUM 80 MG/0.8ML ~~LOC~~ SOLN
0.5000 mg/kg | SUBCUTANEOUS | Status: DC
  Administered 2020-05-10: 70 mg via SUBCUTANEOUS
  Filled 2020-05-10: qty 0.8

## 2020-05-10 MED ORDER — METHOCARBAMOL 500 MG PO TABS
750.0000 mg | ORAL_TABLET | Freq: Three times a day (TID) | ORAL | Status: DC
  Administered 2020-05-10 – 2020-05-11 (×3): 750 mg via ORAL
  Filled 2020-05-10 (×5): qty 2

## 2020-05-10 MED ORDER — SACCHAROMYCES BOULARDII 250 MG PO CAPS
250.0000 mg | ORAL_CAPSULE | Freq: Two times a day (BID) | ORAL | Status: DC
  Administered 2020-05-10 – 2020-05-11 (×3): 250 mg via ORAL
  Filled 2020-05-10 (×3): qty 1

## 2020-05-10 NOTE — Plan of Care (Signed)
Helping patient to maintain adequate nutrition by encouraging her to eat the foods on her ordered liquid diet. Deanna Artis RN

## 2020-05-10 NOTE — Progress Notes (Addendum)
Pharmacy Note  44 y/o F s/p laparoscopic appendectomy for acute appendicitis. Pharmacy consulted for Lovenox dosing for VTE prophylaxis.   O: eGFR: 74 Wt. 140 kg, BMI 60 CBC down some  A/P: No significant bleeding reported. Will initiate Lovenox 0.5 mg/kg/day. Will sign off note writing and follow remotely.   Thank you for the consult.   Ulice Dash, PharmD, BCPS

## 2020-05-10 NOTE — Progress Notes (Addendum)
3 Days Post-Op    CC: Abdominal pain  Subjective: Patient is tolerating full liquids and says she is hungry.  Bowels are working.  Objective: Vital signs in last 24 hours: Temp:  [98.8 F (37.1 C)] 98.8 F (37.1 C) (08/16 0545) Pulse Rate:  [80-94] 80 (08/16 0545) Resp:  [16-18] 16 (08/16 0545) BP: (101-114)/(61-80) 111/80 (08/16 0545) SpO2:  [98 %-100 %] 100 % (08/16 0545) Last BM Date: 05/06/20 1849 IV 1000 urine Stool x1 Nothing recorded from the drain - serous fluid in bulb Afebrile, vital signs are stable WBC 9.8   Intake/Output from previous day: 08/15 0701 - 08/16 0700 In: 1849.4 [I.V.:1721; IV Piggyback:128.4] Out: 1000 [Urine:1000] Intake/Output this shift: No intake/output data recorded.  General appearance: alert, cooperative and no distress Resp: clear to auscultation bilaterally GI: Soft, sore, very large abdomen with some clear drainage in the JP.  JP volume not recorded.  Lab Results:  Recent Labs    05/08/20 0302 05/09/20 0325  WBC 17.0* 9.8  HGB 11.1* 9.9*  HCT 31.9* 28.9*  PLT 199 178    BMET Recent Labs    05/08/20 0302  NA 127*  K 4.4  CL 100  CO2 17*  GLUCOSE 198*  BUN 18  CREATININE 1.29*  CALCIUM 8.2*   PT/INR No results for input(s): LABPROT, INR in the last 72 hours.  Recent Labs  Lab 05/06/20 2146  AST 18  ALT 18  ALKPHOS 66  BILITOT 1.0  PROT 7.3  ALBUMIN 3.7     Lipase     Component Value Date/Time   LIPASE 60 (H) 05/06/2020 2146     Prior to Admission medications   Medication Sig Start Date End Date Taking? Authorizing Provider  buPROPion (WELLBUTRIN XL) 300 MG 24 hr tablet Take 300 mg by mouth daily. 05/05/20  Yes [provider]  CONTRAVE 8-90 MG TB12 Take 2 tablets by mouth 2 (two) times daily. 03/13/20  Yes [provider]  HYDROcodone-acetaminophen (NORCO) 5-325 MG tablet Take 1 tablet by mouth every 4 (four) hours as needed for severe pain. 06/15/19  Yes Curatolo, Adam, DO   ibuprofen (ADVIL) 800 MG tablet Take 1 tablet (800 mg total) by mouth 3 (three) times daily as needed (for pain). 06/15/19  Yes Curatolo, Adam, DO  lisinopril (ZESTRIL) 40 MG tablet Take 40 mg by mouth 2 (two) times daily. 04/17/20  Yes [provider]  metFORMIN (GLUCOPHAGE) 500 MG tablet Take 1 tablet (500 mg total) by mouth 2 (two) times daily with a meal. 10/14/18  Yes Palumbo, April, MD  ondansetron (ZOFRAN) 4 MG tablet Take 4 mg by mouth 3 (three) times daily as needed for nausea. 04/20/20  Yes [provider]  promethazine (PHENERGAN) 25 MG tablet Take 25 mg by mouth 3 (three) times daily as needed for vomiting.  04/25/20  Yes [provider]  tiZANidine (ZANAFLEX) 4 MG capsule Take 4 mg by mouth 2 (two) times daily as needed for muscle spasms. 02/07/20  Yes [provider]  Vitamin D, Ergocalciferol, (DRISDOL) 1.25 MG (50000 UNIT) CAPS capsule Take 50,000 Units by mouth once a week. Sat/Sun 04/16/20  Yes [provider]    . 0.9 % NaCl with KCl 20 mEq / L 125 mL/hr at 05/10/20 0825  . piperacillin-tazobactam (ZOSYN)  IV 3.375 g (05/10/20 0531)   Medications:   Assessment/Plan Chronic pain - Norco 5/325 #90 per month Degenerative disc disease Hypertension Diabetes Polycystic ovarian syndrome Bilateral renal calculi without obstruction. AKI/dehydration   -  Creatinine 1.41 >>1.29  Acute gangrenous appendicitis with abscess Laparoscopic appendectomy 5/63/8937, Dr. Leighton Ruff  FEN: Full liquids/IV fluids >> soft diet ID: Zosyn 8/12 >>day2 DVT: SCDs Follow-up: [facility] clinic Dilaudid 1 mg IV x 8 yesterday   Plan: Patient has a history of chronic narcotic use for degenerative disc disease.  She took 8 mg of IV Dilaudid yesterday.  I can get her back on her preadmission medicines.  I have given her p.o. Tylenol along with the Norco and Robaxin to assist with pain control.  We have restarted her Wellbutrin, lisinopril, and Metformin.  Mobilize  her more and if she is doing well home in AM.  She will need at least 5 days of postop antibiotics. Add probiotic today also. Lovenox per pharmacy for DVT prophylaxis.   LOS: 1 day    Jex Strausbaugh 05/10/2020 Please see Amion

## 2020-05-10 NOTE — Discharge Instructions (Signed)
[facility]  Please arrive at least 30 min before your appointment to complete your check in paperwork.  If you are unable to arrive 30 min prior to your appointment time we may have to cancel or reschedule you. LAPAROSCOPIC SURGERY: POST OP INSTRUCTIONS Always review your discharge instruction sheet given to you by the facility where your surgery was performed. IF YOU HAVE DISABILITY OR FAMILY LEAVE FORMS, YOU MUST BRING THEM TO THE OFFICE FOR PROCESSING.   DO NOT GIVE THEM TO YOUR DOCTOR.  PAIN CONTROL  1. First take acetaminophen (Tylenol) AND/or ibuprofen (Advil) to control your pain after surgery.  Follow directions on package.  Taking acetaminophen (Tylenol) and/or ibuprofen (Advil) regularly after surgery will help to control your pain and lower the amount of prescription pain medication you may need.  You should not take more than 4,000 mg (4 grams) of acetaminophen (Tylenol) in 24 hours.  You should not take ibuprofen (Advil), aleve, motrin, naprosyn or other NSAIDS if you have a history of stomach ulcers or chronic kidney disease.  2. A prescription for pain medication may be given to you upon discharge.  Take your pain medication as prescribed, if you still have uncontrolled pain after taking acetaminophen (Tylenol) or ibuprofen (Advil). 3. Use ice packs to help control pain. 4. If you need a refill on your pain medication, please contact your pharmacy.  They will contact our office to request authorization. Prescriptions will not be filled after 5pm or on week-ends.  HOME MEDICATIONS 5. Take your usually prescribed medications unless otherwise directed.  DIET 6. You should follow a light diet the first few days after arrival home.  Be sure to include lots of fluids daily. Avoid fatty, fried foods.   CONSTIPATION 7. It is common to experience some constipation after surgery and if you are taking pain medication.  Increasing fluid intake and taking a stool  softener (such as Colace) will usually help or prevent this problem from occurring.  A mild laxative (Milk of Magnesia or Miralax) should be taken according to package instructions if there are no bowel movements after 48 hours.  WOUND/INCISION CARE 8. Most patients will experience some swelling and bruising in the area of the incisions.  Ice packs will help.  Swelling and bruising can take several days to resolve.  9. Unless discharge instructions indicate otherwise, follow guidelines below  a. STERI-STRIPS - you may remove your outer bandages 48 hours after surgery, and you may shower at that time.  You have steri-strips (small skin tapes) in place directly over the incision.  These strips should be left on the skin for 7-10 days.   b. DERMABOND/SKIN GLUE - you may shower in 24 hours.  The glue will flake off over the next 2-3 weeks. 10. Any sutures or staples will be removed at the office during your follow-up visit.  ACTIVITIES 11. You may resume regular (light) daily activities beginning the next day--such as daily self-care, walking, climbing stairs--gradually increasing activities as tolerated.  You may have sexual intercourse when it is comfortable.  Refrain from any heavy lifting or straining until approved by your doctor. a. You may drive when you are no longer taking prescription pain medication, you can comfortably wear a seatbelt, and you can safely maneuver your car and apply brakes.  FOLLOW-UP 12. You should see your doctor in the office for a follow-up appointment approximately 2-3 weeks after your surgery.  You should have been given your post-op/follow-up appointment when   your surgery was scheduled.  If you did not receive a post-op/follow-up appointment, make sure that you call for this appointment within a day or two after you arrive home to insure a convenient appointment time.   WHEN TO CALL YOUR DOCTOR: 1. Fever over 101.0 2. Inability to urinate 3. Continued bleeding from  incision. 4. Increased pain, redness, or drainage from the incision. 5. Increasing abdominal pain  The clinic staff is available to answer your questions during regular business hours.  Please don't hesitate to call and ask to speak to one of the nurses for clinical concerns.  If you have a medical emergency, go to the nearest emergency room or call 911.  A surgeon from [facility] is always on call at the hospital. [address], [place], [place], [place]  [address] ? [address], [place], [place]   [address] [phone] ? [phone] ? FAX [phone]   [facility] drains are used to remove extra fluid that normally builds up in a surgical wound after surgery. A surgical drain helps to heal a surgical wound. Different kinds of surgical drains include:  Active drains. These drains use suction to pull drainage away from the surgical wound. Drainage flows through a tube to a container outside of the body. With these drains, you need to keep the bulb or the drainage container flat (compressed) at all times, except while you empty it. Flattening the bulb or container creates suction.  Passive drains. These drains allow fluid to drain naturally, by gravity. Drainage flows through a tube to a bandage (dressing) or a container outside of the body. Passive drains do not need to be emptied. A drain is placed during surgery. Right after surgery, drainage is usually bright red and a little thicker than water. The drainage may gradually turn yellow or pink and become thinner. It is likely that your health care provider will remove the drain when the drainage stops or when the amount decreases to 1-2 Tbsp (15-30 mL) during a 24-hour period. Supplies needed:  Tape.  Germ-free cleaning solution (sterile saline).  Cotton swabs.  Split gauze drain sponge: 4 x 4 inches (10 x 10 cm).  Gauze square: 4 x 4 inches (10 x 10 cm). How to care for your surgical  drain Care for your drain as told by your health care provider. This is important to help prevent infection. If your drain is placed at your back, or any other hard-to-reach area, ask another person to assist you in performing the following tasks: General care  Keep the skin around the drain dry and covered with a dressing at all times.  Check your drain area every day for signs of infection. Check for: ? Redness, swelling, or pain. ? Pus or a bad smell. ? Cloudy drainage. ? Tenderness or pressure at the drain exit site. Changing the dressing Follow instructions from your health care provider about how to change your dressing. Change your dressing at least once a day. Change it more often if needed to keep the dressing dry. Make sure you: 1. Gather your supplies. 2. Wash your hands with soap and water before you change your dressing. If soap and water are not available, use hand sanitizer. 3. Remove the old dressing. Avoid using scissors to do that. 4. Wash your hands with soap and water again after removing the old dressing. 5. Use sterile saline to clean your skin around the drain. You may need to use a  cotton swab to clean the skin. 6. Place the tube through the slit in a drain sponge. Place the drain sponge so that it covers your wound. 7. Place the gauze square or another drain sponge on top of the drain sponge that is on the wound. Make sure the tube is between those layers. 8. Tape the dressing to your skin. 9. Tape the drainage tube to your skin 1-2 inches (2.5-5 cm) below the place where the tube enters your body. Taping keeps the tube from pulling on any stitches (sutures) that you have. 10. Wash your hands with soap and water. 11. Write down the color of your drainage and how often you change your dressing. How to empty your active drain  1. Make sure that you have a measuring cup that you can empty your drainage into. 2. Wash your hands with soap and water. If soap and water  are not available, use hand sanitizer. 3. Loosen any pins or clips that hold the tube in place. 4. If your health care provider tells you to strip the tube to prevent clots and tube blockages: ? Hold the tube at the skin with one hand. Use your other hand to pinch the tubing with your thumb and first finger. ? Gently move your fingers down the tube while squeezing very lightly. This clears any drainage, clots, or tissue from the tube. ? You may need to do this several times each day to keep the tube clear. Do not pull on the tube. 5. Open the bulb cap or the drain plug. Do not touch the inside of the cap or the bottom of the plug. 6. Turn the device upside down and gently squeeze. 7. Empty all of the drainage into the measuring cup. 8. Compress the bulb or the container and replace the cap or the plug. To compress the bulb or the container, squeeze it firmly in the middle while you close the cap or plug the container. 9. Write down the amount of drainage that you have in each 24-hour period. If you have less than 2 Tbsp (30 mL) of drainage during 24 hours, contact your health care provider. 10. Flush the drainage down the toilet. 11. Wash your hands with soap and water. Contact a health care provider if:  You have redness, swelling, or pain around your drain area.  You have pus or a bad smell coming from your drain area.  You have a fever or chills.  The skin around your drain is warm to the touch.  The amount of drainage that you have is increasing instead of decreasing.  You have drainage that is cloudy.  There is a sudden stop or a sudden decrease in the amount of drainage that you have.  Your drain tube falls out.  Your active drain does not stay compressed after you empty it. Summary  Surgical drains are used to remove extra fluid that normally builds up in a surgical wound after surgery.  Different kinds of surgical drains include active drains and passive drains. Active  drains use suction to pull drainage away from the surgical wound, and passive drains allow fluid to drain naturally.  It is important to care for your drain to prevent infection. If your drain is placed at your back, or any other hard-to-reach area, ask another person to assist you.  Contact your health care provider if you have redness, swelling, or pain around your drain area. This information is not intended to replace advice given to  you by your health care provider. Make sure you discuss any questions you have with your health care provider. Document Revised: 10/16/2018 Document Reviewed: 10/16/2018 Elsevier Patient Education  2020 [employer].

## 2020-05-10 NOTE — Discharge Summary (Signed)
Physician Discharge Summary  Patient ID: Karen Gibson MRN: [id number] DOB/AGE: [birth year] 44 y.o.  Admit date: 05/07/2020 Discharge date: 05/11/2020  Admission Diagnoses:  Acute appendicitis Chronic pain-Norco 5/325#90 per month Degenerative disc disease Hypertension Diabetes Polycystic ovarian syndrome Bilateral renal calculi without obstruction. AKI/dehydration  Discharge Diagnoses:  Acute gangrenous appendicitis with abscess Chronic pain-Norco 5/325#90 per month Degenerative disc disease Hypertension Diabetes Polycystic ovarian syndrome Bilateral renal calculi without obstruction. AKI/dehydration  Active Problems:   Acute appendicitis   PROCEDURES: Laparoscopic appendectomy 01/19/8340, Dr. Moishe Spice Course:  Patient is a 44 year old female presented to the ED at [facility] yesterday complaining of right lower quadrant pain that began around 5 AM in the morning.  She reported gradual onset pain was 10/10 she presented with a low-grade fever in the ED and having chills last evening.  Work-up in the ED shows temperature of 100, heart rate 120, blood pressure 128/54.  Sats are good on room air.  WBC 13.4, H/H 12.4/36.4, platelets 232,000.  CMP shows sodium 134, CO2 17, creatinine 1.41, lipase 60, remainder the CMP is normal.  Pregnancy test is negative.  Covid is negative.  CT the abdomen shows the appendix is enlarged and inflamed.  The appendix measures approximately 2 cm in diameter. The appendix is located in the right lower quadrant posterior to the cecum there is no drainable fluid collection or abscess.  No evidence of perforation.  Is also nonobstructing bilateral renal calculi with, no hydronephrosis.  Patient is transferred from [facility] to [facility] long hospital for [place] to see and evaluate. Past medical history includes degenerative disc disease with chronic pain, diabetes, hypertension, polycystic ovarian syndrome.  Pt was  admitted and taken to the OR that morning.  She was found to have gangrenous appendicitis with abscess.  Abscess was drained and the appendix removed. A drain was left in the RLQ, with plans to leave it for at least one week.  She was started on Zosyn and has remained on it in the hospital.  Because of the abscess we plan to continue this for at least 5 days after discharge.  She has chronic pain issues and is on a contract for Norco with her PCP.   She has done well on liquids post op and we advanced her to a soft diet on 05/10/20.  She has not been transitioned to PO pain meds and took 8 mg of Dilaudid on 05/09/20.  We started transition to her pre admit oral chronic narcotics.  I decreased her Lisinopril because her BP was to low for her admit dose.  She has a drain and we plan to leave it until 05/14/20, with plans to remove in the office.  I have ask staff to teach him how to care for the drain.   By 05/11/2020, she is tolerating a soft diet.  She is tolerating pain with p.o. pain medications.  Her blood pressure has been essentially normal throughout her hospitalization without her lisinopril.  I restarted her on a low-dose 20 mg daily on 05/10/2020, her last blood pressure is 107/63.  I have asked her to get a blood pressure cuff and record blood pressures at home for her primary care to review.  I will also ask her to hold on her ibuprofen, until her primary care reviews this also.  Creatinine and WBC are both markedly improved labs are below.  She is ready for discharge on 05/11/2020.  CBC Latest Ref Rng & Units  05/11/2020 05/09/2020 05/08/2020  WBC 4.0 - 10.5 K/uL 6.8 9.8 17.0(H)  Hemoglobin 12.0 - 15.0 g/dL 9.7(L) 9.9(L) 11.1(L)  Hematocrit 36 - 46 % 29.1(L) 28.9(L) 31.9(L)  Platelets 150 - 400 K/uL 190 178 199   CMP Latest Ref Rng & Units 05/11/2020 05/08/2020 05/06/2020  Glucose 70 - 99 mg/dL 99 198(H) 94  BUN 6 - 20 mg/dL 9 18 18   Creatinine 0.44 - 1.00 mg/dL 1.09(H) 1.29(H) 1.41(H)  Sodium 135 -  145 mmol/L 139 127(L) 134(L)  Potassium 3.5 - 5.1 mmol/L 4.5 4.4 4.6  Chloride 98 - 111 mmol/L 109 100 105  CO2 22 - 32 mmol/L 22 17(L) 17(L)  Calcium 8.9 - 10.3 mg/dL 8.1(L) 8.2(L) 9.0  Total Protein 6.5 - 8.1 g/dL - - 7.3  Total Bilirubin 0.3 - 1.2 mg/dL - - 1.0  Alkaline Phos 38 - 126 U/L - - 66  AST 15 - 41 U/L - - 18  ALT 0 - 44 U/L - - 18   CT scan 05/06/2020: Lower chest: The visualized lung bases are clear.  No intra-abdominal free air or free fluid.  Hepatobiliary: Probable mild fatty liver. No intrahepatic biliary ductal dilatation. The gallbladder is unremarkable.  Pancreas: Unremarkable. No pancreatic ductal dilatation or surrounding inflammatory changes.  Spleen: Normal in size without focal abnormality.  Adrenals/Urinary Tract: The adrenal glands unremarkable. There are nonobstructing bilateral renal calculi. Several linear adjacent stones in the inferior pole of the left kidney measure approximately 15 mm in combined length. There is no hydronephrosis on either side. There is symmetric enhancement and excretion of contrast by both kidneys. The visualized ureters and urinary bladder appear unremarkable.  Stomach/Bowel: The appendix is enlarged and inflamed. The appendix measures approximately 2 cm in diameter. The appendix is located in the right lower quadrant posterior to the cecum. There is no drainable fluid collection or abscess. No evidence of perforation. There is no bowel obstruction.  Vascular/Lymphatic: The abdominal aorta and IVC unremarkable. No portal venous gas. There is no adenopathy.  Reproductive: The uterus and ovaries are grossly unremarkable.  Other: None  Musculoskeletal: Degenerative changes of the spine. No acute osseous pathology.  IMPRESSION: 1. Acute appendicitis. No abscess or perforation. 2. Nonobstructing bilateral renal calculi. No hydronephrosis.    Condition on discharge: Improved.  Disposition:  Discharge disposition: 01-Home or Self Care        Allergies as of 05/11/2020      Reactions   Bee Venom Other (See Comments)   Shortness of breath/throat swells.      Medication List    TAKE these medications   acetaminophen 325 MG tablet Commonly known as: TYLENOL You can take 650 mg every 6 hours as needed for pain. DO NOT TAKE MORE THAN 4000 MG OF TYLENOL PER DAY.  IT CAN HARM YOUR LIVER.  TYLENOL (ACETAMINOPHEN) IS ALSO IN YOUR PRESCRIPTION PAIN MEDICATION.  YOU HAVE TO COUNT IT IN YOUR DAILY TOTAL.   amoxicillin-clavulanate 875-125 MG tablet Commonly known as: Augmentin Take 1 tablet every 12 hours for the next 72 hours.   buPROPion 300 MG 24 hr tablet Commonly known as: WELLBUTRIN XL Take 300 mg by mouth daily.   Contrave 8-90 MG Tb12 Generic drug: Naltrexone-buPROPion HCl ER Take 2 tablets by mouth 2 (two) times daily.   HYDROcodone-acetaminophen 5-325 MG tablet Commonly known as: Norco Take 1 tablet by mouth every 4 (four) hours as needed for severe pain.   ibuprofen 800 MG tablet Commonly known as: ADVIL Do not resume  this until your primary care doctor see's you and allows you to use it again. What changed:   how much to take  how to take this  when to take this  reasons to take this  additional instructions   lisinopril 40 MG tablet Commonly known as: ZESTRIL Decrease your home dose of this to 1/2 tablet per day.  Record your blood pressures at home 3-4 times a day and take with you to your primary care physician. What changed:   how much to take  how to take this  when to take this  additional instructions   metFORMIN 500 MG tablet Commonly known as: GLUCOPHAGE Take 1 tablet (500 mg total) by mouth 2 (two) times daily with a meal.   methocarbamol 500 MG tablet Commonly known as: ROBAXIN Take 1 tablet (500 mg total) by mouth 3 (three) times daily.   ondansetron 4 MG tablet Commonly known as: ZOFRAN Take 4 mg by mouth 3 (three)  times daily as needed for nausea.   promethazine 25 MG tablet Commonly known as: PHENERGAN Take 25 mg by mouth 3 (three) times daily as needed for vomiting.   saccharomyces boulardii 250 MG capsule Commonly known as: FLORASTOR This is the probiotic we spoke about.  It helps replace good bacteria in your colon.  You can buy this in the stool section at any drugstore.  Follow package directions.  I would use it for the next 2 weeks.   tiZANidine 4 MG capsule Commonly known as: ZANAFLEX Take 4 mg by mouth 2 (two) times daily as needed for muscle spasms.   Vitamin D (Ergocalciferol) 1.25 MG (50000 UNIT) Caps capsule Commonly known as: DRISDOL Take 50,000 Units by mouth once a week. Sat/Sun       [place] Follow up.   Why: For follow up of your sleep apnea.  Follow up with your primary for your medical issues and pain control.   Contact information: [place] [address] [phone]        Sur[facility], [place]. Go on 05/14/2020.   Specialty: General Surgery Why: 2:45 PM.  Please bring a copy of your photo ID and insurance card. Please arrive 30 minutes prior to your appointment for drain removal.   Your second appointmebt is: 05/27/20 @2 :47 DOW appointment  Contact information: [place] [address] [phone]               Signed: Earnstine Regal 05/11/2020, 11:25 AM

## 2020-05-11 LAB — CBC
HCT: 29.1 % — ABNORMAL LOW (ref 36.0–46.0)
Hemoglobin: 9.7 g/dL — ABNORMAL LOW (ref 12.0–15.0)
MCH: 31.2 pg (ref 26.0–34.0)
MCHC: 33.3 g/dL (ref 30.0–36.0)
MCV: 93.6 fL (ref 80.0–100.0)
Platelets: 190 10*3/uL (ref 150–400)
RBC: 3.11 MIL/uL — ABNORMAL LOW (ref 3.87–5.11)
RDW: 11.9 % (ref 11.5–15.5)
WBC: 6.8 10*3/uL (ref 4.0–10.5)
nRBC: 0 % (ref 0.0–0.2)

## 2020-05-11 LAB — BASIC METABOLIC PANEL
Anion gap: 8 (ref 5–15)
BUN: 9 mg/dL (ref 6–20)
CO2: 22 mmol/L (ref 22–32)
Calcium: 8.1 mg/dL — ABNORMAL LOW (ref 8.9–10.3)
Chloride: 109 mmol/L (ref 98–111)
Creatinine, Ser: 1.09 mg/dL — ABNORMAL HIGH (ref 0.44–1.00)
GFR calc Af Amer: >60 mL/min (ref >60)
GFR calc non Af Amer: >60 mL/min (ref >60)
Glucose, Bld: 99 mg/dL (ref 70–99)
Potassium: 4.5 mmol/L (ref 3.5–5.1)
Sodium: 139 mmol/L (ref 135–145)

## 2020-05-11 MED ORDER — SACCHAROMYCES BOULARDII 250 MG PO CAPS: ORAL_CAPSULE | ORAL | Status: AC

## 2020-05-11 MED ORDER — AMOXICILLIN-POT CLAVULANATE 875-125 MG PO TABS: ORAL_TABLET | ORAL | 0 refills | Status: AC

## 2020-05-11 MED ORDER — IBUPROFEN 800 MG PO TABS: ORAL_TABLET | ORAL | 0 refills | Status: AC

## 2020-05-11 MED ORDER — METHOCARBAMOL 500 MG PO TABS: 500.0000 mg | ORAL_TABLET | Freq: Three times a day (TID) | ORAL | 0 refills | Status: AC

## 2020-05-11 MED ORDER — ACETAMINOPHEN 325 MG PO TABS: ORAL_TABLET | ORAL | Status: AC

## 2020-05-11 MED ORDER — FLUCONAZOLE 150 MG PO TABS
150.0000 mg | ORAL_TABLET | Freq: Once | ORAL | Status: AC
  Administered 2020-05-11: 150 mg via ORAL
  Filled 2020-05-11: qty 1

## 2020-05-11 MED ORDER — LISINOPRIL 40 MG PO TABS: ORAL_TABLET | ORAL | Status: AC

## 2020-05-11 MED ORDER — AMOXICILLIN-POT CLAVULANATE 875-125 MG PO TABS
1.0000 | ORAL_TABLET | Freq: Two times a day (BID) | ORAL | 0 refills | Status: DC
Start: 2020-05-11 — End: 2020-05-11

## 2020-05-11 NOTE — Progress Notes (Signed)
4 Days Post-Op    CC: Abdominal pain  Subjective: She is still pretty sore but overall doing well.  Her port sites all look good.  JP drain is serosanguineous.  She is tolerating her diet.  Objective: Vital signs in last 24 hours: Temp:  [98.4 F (36.9 C)-99.2 F (37.3 C)] 98.4 F (36.9 C) (08/17 0603) Pulse Rate:  [76-92] 76 (08/17 0603) Resp:  [16-18] 18 (08/17 0603) BP: (107-125)/(63-85) 107/63 (08/17 0603) SpO2:  [97 %-100 %] 100 % (08/17 0603) Last BM Date: 05/09/20 660 p.o. 2000 IV 2000 urine To 60 drain BM x1 Afebrile vital signs are stable Creatinine down to 1.09 WBC 6.8 H&H is stable. Intake/Output from previous day: 08/16 0701 - 08/17 0700 In: 2628.7 [P.O.:660; I.V.:1818.6; IV Piggyback:150.1] Out: 2260 [Urine:2000; Drains:260] Intake/Output this shift: Total I/O In: 240 [P.O.:240] Out: -   General appearance: alert, cooperative and no distress Resp: clear to auscultation bilaterally GI: Soft, sore, port sites all look good.  Lab Results:  Recent Labs    05/09/20 0325 05/11/20 0319  WBC 9.8 6.8  HGB 9.9* 9.7*  HCT 28.9* 29.1*  PLT 178 190    BMET Recent Labs    05/11/20 0319  NA 139  K 4.5  CL 109  CO2 22  GLUCOSE 99  BUN 9  CREATININE 1.09*  CALCIUM 8.1*   PT/INR No results for input(s): LABPROT, INR in the last 72 hours.  Recent Labs  Lab 05/06/20 2146  AST 18  ALT 18  ALKPHOS 66  BILITOT 1.0  PROT 7.3  ALBUMIN 3.7     Lipase     Component Value Date/Time   LIPASE 60 (H) 05/06/2020 2146     Medications: . acetaminophen  650 mg Oral Q8H  . buPROPion  300 mg Oral Daily  . enoxaparin (LOVENOX) injection  0.5 mg/kg Subcutaneous Q24H  . lisinopril  20 mg Oral Daily  . metFORMIN  500 mg Oral BID WC  . methocarbamol  750 mg Oral TID  . saccharomyces boulardii  250 mg Oral BID    Assessment/Plan Chronic pain-Norco 5/325#90 per month Degenerative disc disease Hypertension Diabetes Polycystic ovarian  syndrome Bilateral renal calculi without obstruction. AKI/dehydration -Creatinine 1.41 >>1.29>>1.09  Acute gangrenous appendicitis with abscess  - WBC 13.4>> 17.0>> 9.8>> 6.8 Laparoscopic appendectomy 5/42/7062, Dr. Leighton Ruff, POD #4  FEN: Full liquids/IV fluids >> soft diet ID: Zosyn 8/12>>day 6 DVT: SCDs/Lovenox Follow-up: [facility] Pain control Tylenol 650x2, Wellbutrin restarted, Norco 5/325 2 tablets yesterday, 2 so far this a.m., Dilaudid 1 mg, x3, Glucophage restarted yesterday, Robaxin 750x2 yesterday and 1 this a.m.  Plan: Discharge home today.  3 more days of p.o. antibiotics.  We decreased her lisinopril to 20 mg daily.  I asked her to hold up on ibuprofen at home until she returns to see her primary care and they can review her renal function.      LOS: 2 days    Karen Gibson 05/11/2020 Please see Amion

## 2020-05-11 NOTE — Progress Notes (Signed)
RN reviewed discharge instructions with patient and family. All questions answered.   Paperwork given. Prescriptions electronically sent to patient pharmacy.    NT rolled patient down with all belongings to family car.     Frayda Egley, RN  

## 2020-09-14 ENCOUNTER — Ambulatory Visit: Payer: Managed Care, Other (non HMO) | Admitting: Dietician

## 2020-10-04 ENCOUNTER — Other Ambulatory Visit: Payer: Self-pay | Admitting: Urology

## 2020-10-26 ENCOUNTER — Ambulatory Visit: Payer: Managed Care, Other (non HMO) | Admitting: Dietician

## 2020-11-01 NOTE — Progress Notes (Signed)
DUE TO COVID-19 ONLY ONE VISITOR IS ALLOWED TO COME WITH YOU AND STAY IN THE WAITING ROOM ONLY DURING PRE OP AND PROCEDURE DAY OF SURGERY. THE 1 VISITOR  MAY VISIT WITH YOU AFTER SURGERY IN YOUR PRIVATE ROOM DURING VISITING HOURS ONLY!  YOU NEED TO HAVE A COVID 19 TEST ON___2/14/2022 ____ '@_______'$ , THIS TEST MUST BE DONE BEFORE SURGERY,  COVID TESTING SITE [address] [place] [address], IT IS ON THE RIGHT GOING OUT [address] APPROXIMATELY  2 MINUTES PAST [employer] ON THE RIGHT. ONCE YOUR COVID TEST IS COMPLETED,  PLEASE BEGIN THE QUARANTINE INSTRUCTIONS AS OUTLINED IN YOUR HANDOUT.                Karen Gibson  11/01/2020   Your procedure is scheduled on: 11/12/2020    Report to [facility] Main  Entrance   Report to admitting at   1015 AM     Call this number if you have problems the morning of surgery [phone]    Remember: Do not eat food , candy gum or mints :After Midnight. You may have clear liquids from midnight until 0915 AM     CLEAR LIQUID DIET   Foods Allowed                                                                       Coffee and tea, regular and decaf                              Plain Jell-O any favor except red or purple                                            Fruit ices (not with fruit pulp)                                      Iced Popsicles                                     Carbonated beverages, regular and diet                                    Cranberry, grape and apple juices Sports drinks like Gatorade Lightly seasoned clear broth or consume(fat free) Sugar, honey syrup   _____________________________________________________________________    BRUSH YOUR TEETH MORNING OF SURGERY AND RINSE YOUR MOUTH OUT, NO CHEWING GUM CANDY OR MINTS.     Take these medicines the morning of surgery with A SIP OF WATER: WELLBUTRIN   DO NOT TAKE ANY DIABETIC MEDICATIONS DAY OF YOUR SURGERY                                You may not have any metal on your  body including hair pins and              piercings  Do not wear jewelry, make-up, lotions, powders or perfumes, deodorant             Do not wear nail polish on your fingernails.  Do not shave  48 hours prior to surgery.              Men may shave face and neck.   Do not bring valuables to the hospital. [place].  Contacts, dentures or bridgework may not be worn into surgery.  Leave suitcase in the car. After surgery it may be brought to your room.     Patients discharged the day of surgery will not be allowed to drive home. IF YOU ARE HAVING SURGERY AND GOING HOME THE SAME DAY, YOU MUST HAVE AN ADULT TO DRIVE YOU HOME AND BE WITH YOU FOR 24 HOURS. YOU MAY GO HOME BY TAXI OR UBER OR ORTHERWISE, BUT AN ADULT MUST ACCOMPANY YOU HOME AND STAY WITH YOU FOR 24 HOURS.  Name and phone number of your driver:  Special Instructions: N/A              Please read over the following fact sheets you were given: _____________________________________________________________________  [facility] - Preparing for Surgery Before surgery, you can play an important role.  Because skin is not sterile, your skin needs to be as free of germs as possible.  You can reduce the number of germs on your skin by washing with CHG (chlorahexidine gluconate) soap before surgery.  CHG is an antiseptic cleaner which kills germs and bonds with the skin to continue killing germs even after washing. Please DO NOT use if you have an allergy to CHG or antibacterial soaps.  If your skin becomes reddened/irritated stop using the CHG and inform your nurse when you arrive at Short Stay. Do not shave (including legs and underarms) for at least 48 hours prior to the first CHG shower.  You may shave your face/neck. Please follow these instructions carefully:  1.  Shower with CHG Soap the night before surgery and the  morning of Surgery.  2.  If you  choose to wash your hair, wash your hair first as usual with your  normal  shampoo.  3.  After you shampoo, rinse your hair and body thoroughly to remove the  shampoo.                           4.  Use CHG as you would any other liquid soap.  You can apply chg directly  to the skin and wash                       Gently with a scrungie or clean washcloth.  5.  Apply the CHG Soap to your body ONLY FROM THE NECK DOWN.   Do not use on face/ open                           Wound or open sores. Avoid contact with eyes, ears mouth and genitals (private parts).                       Wash face,  Development worker, international aid (private  parts) with your normal soap.             6.  Wash thoroughly, paying special attention to the area where your surgery  will be performed.  7.  Thoroughly rinse your body with warm water from the neck down.  8.  DO NOT shower/wash with your normal soap after using and rinsing off  the CHG Soap.                9.  Pat yourself dry with a clean towel.            10.  Wear clean pajamas.            11.  Place clean sheets on your bed the night of your first shower and do not  sleep with pets. Day of Surgery : Do not apply any lotions/deodorants the morning of surgery.  Please wear clean clothes to the hospital/surgery center.  FAILURE TO FOLLOW THESE INSTRUCTIONS MAY RESULT IN THE CANCELLATION OF YOUR SURGERY PATIENT SIGNATURE_________________________________  NURSE SIGNATURE__________________________________  ________________________________________________________________________

## 2020-11-02 ENCOUNTER — Encounter (HOSPITAL_COMMUNITY)
Admission: RE | Admit: 2020-11-02 | Discharge: 2020-11-02 | Disposition: A | Discharge status: Home / Self Care | Payer: Managed Care, Other (non HMO) | Source: Ambulatory Visit

## 2020-11-04 NOTE — Patient Instructions (Addendum)
DUE TO COVID-19 ONLY ONE VISITOR IS ALLOWED TO COME WITH YOU AND STAY IN THE WAITING ROOM ONLY DURING PRE OP AND PROCEDURE DAY OF SURGERY. THE 1 VISITOR  MAY VISIT WITH YOU AFTER SURGERY IN YOUR PRIVATE ROOM DURING VISITING HOURS ONLY!  YOU NEED TO HAVE A COVID 19 TEST ON___2/15/2022 ____ '@_835'$  am______, THIS TEST MUST BE DONE BEFORE SURGERY,  COVID TESTING SITE [address] [place] [address], IT IS ON THE RIGHT GOING OUT [address] APPROXIMATELY  2 MINUTES PAST [employer] ON THE RIGHT. ONCE YOUR COVID TEST IS COMPLETED,  PLEASE BEGIN THE QUARANTINE INSTRUCTIONS AS OUTLINED IN YOUR HANDOUT.                Karen Gibson  11/04/2020   Your procedure is scheduled on: 11/12/2020    Report to [facility] Main  Entrance   Report to admitting at   1015 AM     Call this number if you have problems the morning of surgery [phone]    Remember: Do not eat food , candy gum or mints :After Midnight. You may have clear liquids from midnight until 0915 AM     CLEAR LIQUID DIET   Foods Allowed                                                                       Coffee and tea, regular and decaf                              Plain Jell-O any favor except red or purple                                            Fruit ices (not with fruit pulp)                                      Iced Popsicles                                     Carbonated beverages, regular and diet                                    Cranberry, grape and apple juices Sports drinks like Gatorade Lightly seasoned clear broth or consume(fat free) Sugar, honey syrup   _____________________________________________________________________    BRUSH YOUR TEETH MORNING OF SURGERY AND RINSE YOUR MOUTH OUT, NO CHEWING GUM CANDY OR MINTS.     Take these medicines the morning of surgery with A SIP OF WATER: WELLBUTRIN   DO NOT TAKE ANY DIABETIC MEDICATIONS DAY OF YOUR SURGERY                                You may not have any metal on  your body including hair pins and              piercings  Do not wear jewelry, make-up, lotions, powders or perfumes, deodorant             Do not wear nail polish on your fingernails.  Do not shave  48 hours prior to surgery.             Do not bring valuables to the hospital. [place].  Contacts, dentures or bridgework may not be worn into surgery.      Patients discharged the day of surgery will not be allowed to drive home. IF YOU ARE HAVING SURGERY AND GOING HOME THE SAME DAY, YOU MUST HAVE AN ADULT TO DRIVE YOU HOME AND BE WITH YOU FOR 24 HOURS. YOU MAY GO HOME BY TAXI OR UBER OR ORTHERWISE, BUT AN ADULT MUST ACCOMPANY YOU HOME AND STAY WITH YOU FOR 24 HOURS.  Name and phone number of your driver:  Special Instructions: N/A              Please read over the following fact sheets you were given: _____________________________________________________________________  [facility] - Preparing for Surgery Before surgery, you can play an important role.  Because skin is not sterile, your skin needs to be as free of germs as possible.  You can reduce the number of germs on your skin by washing with CHG (chlorahexidine gluconate) soap before surgery.  CHG is an antiseptic cleaner which kills germs and bonds with the skin to continue killing germs even after washing. Please DO NOT use if you have an allergy to CHG or antibacterial soaps.  If your skin becomes reddened/irritated stop using the CHG and inform your nurse when you arrive at Short Stay. Do not shave (including legs and underarms) for at least 48 hours prior to the first CHG shower.  You may shave your face/neck. Please follow these instructions carefully:  1.  Shower with CHG Soap the night before surgery and the  morning of Surgery.  2.  If you choose to wash your hair, wash your hair first as usual with your  normal  shampoo.  3.  After  you shampoo, rinse your hair and body thoroughly to remove the  shampoo.                           4.  Use CHG as you would any other liquid soap.  You can apply chg directly  to the skin and wash                       Gently with a scrungie or clean washcloth.  5.  Apply the CHG Soap to your body ONLY FROM THE NECK DOWN.   Do not use on face/ open                           Wound or open sores. Avoid contact with eyes, ears mouth and genitals (private parts).                       Wash face,  Genitals (private parts) with your normal soap.             6.  Wash thoroughly, paying  special attention to the area where your surgery  will be performed.  7.  Thoroughly rinse your body with warm water from the neck down.  8.  DO NOT shower/wash with your normal soap after using and rinsing off  the CHG Soap.                9.  Pat yourself dry with a clean towel.            10.  Wear clean pajamas.            11.  Place clean sheets on your bed the night of your first shower and do not  sleep with pets. Day of Surgery : Do not apply any lotions/deodorants the morning of surgery.  Please wear clean clothes to the hospital/surgery center.  FAILURE TO FOLLOW THESE INSTRUCTIONS MAY RESULT IN THE CANCELLATION OF YOUR SURGERY PATIENT SIGNATURE_________________________________  NURSE SIGNATURE__________________________________  ________________________________________________________________________

## 2020-11-05 ENCOUNTER — Encounter (HOSPITAL_COMMUNITY): Payer: Self-pay

## 2020-11-05 ENCOUNTER — Other Ambulatory Visit: Payer: Self-pay

## 2020-11-05 ENCOUNTER — Encounter (HOSPITAL_COMMUNITY)
Admission: RE | Admit: 2020-11-05 | Discharge: 2020-11-05 | Disposition: A | Discharge status: Home / Self Care | Payer: Managed Care, Other (non HMO) | Source: Ambulatory Visit | Attending: Urology | Admitting: Urology

## 2020-11-05 DIAGNOSIS — Z01818 Encounter for other preprocedural examination: Secondary | ICD-10-CM | POA: Insufficient documentation

## 2020-11-05 DIAGNOSIS — N200 Calculus of kidney: Secondary | ICD-10-CM | POA: Insufficient documentation

## 2020-11-05 HISTORY — DX: Anxiety disorder, unspecified: F41.9

## 2020-11-05 HISTORY — DX: Personal history of urinary calculi: Z87.442

## 2020-11-05 HISTORY — DX: Anemia, unspecified: D64.9

## 2020-11-05 HISTORY — DX: Pneumonia, unspecified organism: J18.9

## 2020-11-05 HISTORY — DX: Chronic kidney disease, unspecified: N18.9

## 2020-11-05 HISTORY — DX: Nontoxic multinodular goiter: E04.2

## 2020-11-05 HISTORY — DX: Other complications of anesthesia, initial encounter: T88.59XA

## 2020-11-05 HISTORY — DX: Depression, unspecified: F32.A

## 2020-11-05 LAB — CBC
HCT: 39.5 % (ref 36.0–46.0)
Hemoglobin: 13.5 g/dL (ref 12.0–15.0)
MCH: 30.7 pg (ref 26.0–34.0)
MCHC: 34.2 g/dL (ref 30.0–36.0)
MCV: 89.8 fL (ref 80.0–100.0)
Platelets: 207 10*3/uL (ref 150–400)
RBC: 4.4 MIL/uL (ref 3.87–5.11)
RDW: 11.9 % (ref 11.5–15.5)
WBC: 8.7 10*3/uL (ref 4.0–10.5)
nRBC: 0 % (ref 0.0–0.2)

## 2020-11-05 LAB — HEMOGLOBIN A1C
Hgb A1c MFr Bld: 5.1 % (ref 4.8–5.6)
Mean Plasma Glucose: 99.67 mg/dL

## 2020-11-05 LAB — BASIC METABOLIC PANEL
Anion gap: 8 (ref 5–15)
BUN: 25 mg/dL — ABNORMAL HIGH (ref 6–20)
CO2: 21 mmol/L — ABNORMAL LOW (ref 22–32)
Calcium: 9.1 mg/dL (ref 8.9–10.3)
Chloride: 108 mmol/L (ref 98–111)
Creatinine, Ser: 1.86 mg/dL — ABNORMAL HIGH (ref 0.44–1.00)
GFR, Estimated: 34 mL/min — ABNORMAL LOW (ref >60)
Glucose, Bld: 88 mg/dL (ref 70–99)
Potassium: 4.8 mmol/L (ref 3.5–5.1)
Sodium: 137 mmol/L (ref 135–145)

## 2020-11-05 LAB — GLUCOSE, CAPILLARY: Glucose-Capillary: 92 mg/dL (ref 70–99)

## 2020-11-05 NOTE — Progress Notes (Signed)
DUE TO COVID-19 ONLY ONE VISITOR IS ALLOWED TO COME WITH YOU AND STAY IN THE WAITING ROOM ONLY DURING PRE OP AND PROCEDURE DAY OF SURGERY. THE 1 VISITOR  MAY VISIT WITH YOU AFTER SURGERY IN YOUR PRIVATE ROOM DURING VISITING HOURS ONLY!  YOU NEED TO HAVE A COVID 19 TEST ON___2/15/2022 ____ '@_______'$ , THIS TEST MUST BE DONE BEFORE SURGERY,  COVID TESTING SITE [address] [place] [address], IT IS ON THE RIGHT GOING OUT [address] APPROXIMATELY  2 MINUTES PAST [employer] ON THE RIGHT. ONCE YOUR COVID TEST IS COMPLETED,  PLEASE BEGIN THE QUARANTINE INSTRUCTIONS AS OUTLINED IN YOUR HANDOUT.                Karen Gibson  11/05/2020   Your procedure is scheduled on: 11/12/2020    Report to [facility] Main  Entrance   Report to admitting at   1015 AM     Call this number if you have problems the morning of surgery [phone]    Remember: Do not eat food , candy gum or mints :After Midnight. You may have clear liquids from midnight until 0915 AM     CLEAR LIQUID DIET   Foods Allowed                                                                       Coffee and tea, regular and decaf                              Plain Jell-O any favor except red or purple                                            Fruit ices (not with fruit pulp)                                      Iced Popsicles                                     Carbonated beverages, regular and diet                                    Cranberry, grape and apple juices Sports drinks like Gatorade Lightly seasoned clear broth or consume(fat free) Sugar, honey syrup   _____________________________________________________________________    BRUSH YOUR TEETH MORNING OF SURGERY AND RINSE YOUR MOUTH OUT, NO CHEWING GUM CANDY OR MINTS.     Take these medicines the morning of surgery with A SIP OF WATER: WELLBUTRIN   DO NOT TAKE ANY DIABETIC MEDICATIONS DAY OF YOUR SURGERY                                You may not have any metal on your  body including hair pins and              piercings  Do not wear jewelry, make-up, lotions, powders or perfumes, deodorant             Do not wear nail polish on your fingernails.  Do not shave  48 hours prior to surgery.              Men may shave face and neck.   Do not bring valuables to the hospital. [place].  Contacts, dentures or bridgework may not be worn into surgery.  Leave suitcase in the car. After surgery it may be brought to your room.     Patients discharged the day of surgery will not be allowed to drive home. IF YOU ARE HAVING SURGERY AND GOING HOME THE SAME DAY, YOU MUST HAVE AN ADULT TO DRIVE YOU HOME AND BE WITH YOU FOR 24 HOURS. YOU MAY GO HOME BY TAXI OR UBER OR ORTHERWISE, BUT AN ADULT MUST ACCOMPANY YOU HOME AND STAY WITH YOU FOR 24 HOURS.  Name and phone number of your driver:  Special Instructions: N/A              Please read over the following fact sheets you were given: _____________________________________________________________________  [facility] - Preparing for Surgery Before surgery, you can play an important role.  Because skin is not sterile, your skin needs to be as free of germs as possible.  You can reduce the number of germs on your skin by washing with CHG (chlorahexidine gluconate) soap before surgery.  CHG is an antiseptic cleaner which kills germs and bonds with the skin to continue killing germs even after washing. Please DO NOT use if you have an allergy to CHG or antibacterial soaps.  If your skin becomes reddened/irritated stop using the CHG and inform your nurse when you arrive at Short Stay. Do not shave (including legs and underarms) for at least 48 hours prior to the first CHG shower.  You may shave your face/neck. Please follow these instructions carefully:  1.  Shower with CHG Soap the night before surgery and the  morning of Surgery.  2.  If you  choose to wash your hair, wash your hair first as usual with your  normal  shampoo.  3.  After you shampoo, rinse your hair and body thoroughly to remove the  shampoo.                           4.  Use CHG as you would any other liquid soap.  You can apply chg directly  to the skin and wash                       Gently with a scrungie or clean washcloth.  5.  Apply the CHG Soap to your body ONLY FROM THE NECK DOWN.   Do not use on face/ open                           Wound or open sores. Avoid contact with eyes, ears mouth and genitals (private parts).                       Wash face,  Development worker, international aid (private  parts) with your normal soap.             6.  Wash thoroughly, paying special attention to the area where your surgery  will be performed.  7.  Thoroughly rinse your body with warm water from the neck down.  8.  DO NOT shower/wash with your normal soap after using and rinsing off  the CHG Soap.                9.  Pat yourself dry with a clean towel.            10.  Wear clean pajamas.            11.  Place clean sheets on your bed the night of your first shower and do not  sleep with pets. Day of Surgery : Do not apply any lotions/deodorants the morning of surgery.  Please wear clean clothes to the hospital/surgery center.  FAILURE TO FOLLOW THESE INSTRUCTIONS MAY RESULT IN THE CANCELLATION OF YOUR SURGERY PATIENT SIGNATURE_________________________________  NURSE SIGNATURE__________________________________  ________________________________________________________________________

## 2020-11-05 NOTE — Progress Notes (Addendum)
PCP - Hubbard Robinson PA [place] Cardiologist - no  PPM/ICD -  Device Orders -  Rep Notified -   Chest x-ray -  EKG -  Stress Test -  ECHO -  Cardiac Cath -   Sleep Study -  CPAP -   Fasting Blood Sugar - 80-100 Checks Blood Sugar __1___ times a day  Blood Thinner Instructions: Aspirin Instructions:  ERAS Protcol - PRE-SURGERY   COVID TEST- 2-15  Activity-Can walk a flight of stairs without SOB Anesthesia review: BMI 61.91, creatine 1.86  Patient denies shortness of breath, fever, cough and chest pain at PAT appointment  none   All instructions explained to the patient, with a verbal understanding of the material. Patient agrees to go over the instructions while at home for a better understanding. Patient also instructed to self quarantine after being tested for COVID-19. The opportunity to ask questions was provided.

## 2020-11-05 NOTE — Progress Notes (Addendum)
PCP -  Cardiologist -   PPM/ICD -  Device Orders -  Rep Notified -   Chest x-ray -  EKG -  Stress Test -  ECHO -  Cardiac Cath -   Sleep Study -  CPAP -   Fasting Blood Sugar -  Checks Blood Sugar _____ times a day  Blood Thinner Instructions: Aspirin Instructions:  ERAS Protcol - PRE-SURGERY   COVID TEST- 2-15   Anesthesia review:   Patient denies shortness of breath, fever, cough and chest pain at PAT appointment   All instructions explained to the patient, with a verbal understanding of the material. Patient agrees to go over the instructions while at home for a better understanding. Patient also instructed to self quarantine after being tested for COVID-19. The opportunity to ask questions was provided.

## 2020-11-09 ENCOUNTER — Other Ambulatory Visit (HOSPITAL_COMMUNITY)
Admission: RE | Admit: 2020-11-09 | Discharge: 2020-11-09 | Disposition: A | Discharge status: Home / Self Care | Payer: Managed Care, Other (non HMO) | Source: Ambulatory Visit | Attending: Urology | Admitting: Urology

## 2020-11-09 DIAGNOSIS — Z01812 Encounter for preprocedural laboratory examination: Secondary | ICD-10-CM | POA: Diagnosis present

## 2020-11-09 DIAGNOSIS — Z20822 Contact with and (suspected) exposure to covid-19: Secondary | ICD-10-CM | POA: Diagnosis not present

## 2020-11-09 LAB — SARS CORONAVIRUS 2 (TAT 6-24 HRS): SARS Coronavirus 2: NEGATIVE

## 2020-11-11 MED ORDER — DEXTROSE 5 % IV SOLN
3.0000 g | INTRAVENOUS | Status: AC
  Administered 2020-11-12: 3 g via INTRAVENOUS
  Filled 2020-11-11: qty 3

## 2020-11-12 ENCOUNTER — Encounter (HOSPITAL_COMMUNITY): Payer: Self-pay | Admitting: Urology

## 2020-11-12 ENCOUNTER — Ambulatory Visit (HOSPITAL_COMMUNITY)
Admission: RE | Admit: 2020-11-12 | Discharge: 2020-11-12 | Disposition: A | Discharge status: Home / Self Care | Payer: Managed Care, Other (non HMO) | Attending: Urology | Admitting: Urology

## 2020-11-12 ENCOUNTER — Ambulatory Visit (HOSPITAL_COMMUNITY): Payer: Managed Care, Other (non HMO) | Admitting: Anesthesiology

## 2020-11-12 ENCOUNTER — Ambulatory Visit (HOSPITAL_COMMUNITY): Payer: Managed Care, Other (non HMO)

## 2020-11-12 ENCOUNTER — Encounter (HOSPITAL_COMMUNITY)
Admission: RE | Disposition: A | Discharge status: Home / Self Care | Payer: Self-pay | Source: Home / Self Care | Attending: Urology

## 2020-11-12 ENCOUNTER — Ambulatory Visit (HOSPITAL_COMMUNITY): Payer: Managed Care, Other (non HMO) | Admitting: Physician Assistant

## 2020-11-12 DIAGNOSIS — N200 Calculus of kidney: Secondary | ICD-10-CM | POA: Insufficient documentation

## 2020-11-12 DIAGNOSIS — N184 Chronic kidney disease, stage 4 (severe): Secondary | ICD-10-CM | POA: Diagnosis not present

## 2020-11-12 DIAGNOSIS — N3020 Other chronic cystitis without hematuria: Secondary | ICD-10-CM | POA: Diagnosis not present

## 2020-11-12 DIAGNOSIS — N393 Stress incontinence (female) (male): Secondary | ICD-10-CM | POA: Diagnosis not present

## 2020-11-12 DIAGNOSIS — E1122 Type 2 diabetes mellitus with diabetic chronic kidney disease: Secondary | ICD-10-CM | POA: Diagnosis not present

## 2020-11-12 HISTORY — PX: CYSTOSCOPY/URETEROSCOPY/HOLMIUM LASER/STENT PLACEMENT: SHX6546

## 2020-11-12 LAB — PREGNANCY, URINE: Preg Test, Ur: NEGATIVE

## 2020-11-12 LAB — GLUCOSE, CAPILLARY
Glucose-Capillary: 98 mg/dL (ref 70–99)
Glucose-Capillary: 100 mg/dL — ABNORMAL HIGH (ref 70–99)

## 2020-11-12 SURGERY — CYSTOSCOPY/URETEROSCOPY/HOLMIUM LASER/STENT PLACEMENT
Anesthesia: General | Laterality: Left

## 2020-11-12 MED ORDER — SODIUM CHLORIDE 0.9 % IR SOLN
Status: DC | PRN
  Administered 2020-11-12: 6000 mL via INTRAVESICAL

## 2020-11-12 MED ORDER — LIDOCAINE 2% (20 MG/ML) 5 ML SYRINGE
INTRAMUSCULAR | Status: DC | PRN
  Administered 2020-11-12: 60 mg via INTRAVENOUS

## 2020-11-12 MED ORDER — PROPOFOL 10 MG/ML IV BOLUS
INTRAVENOUS | Status: AC
  Filled 2020-11-12: qty 20

## 2020-11-12 MED ORDER — FENTANYL CITRATE (PF) 100 MCG/2ML IJ SOLN
INTRAMUSCULAR | Status: AC
  Filled 2020-11-12: qty 2

## 2020-11-12 MED ORDER — SUGAMMADEX SODIUM 500 MG/5ML IV SOLN
INTRAVENOUS | Status: AC
  Filled 2020-11-12: qty 5

## 2020-11-12 MED ORDER — ROCURONIUM BROMIDE 10 MG/ML (PF) SYRINGE
PREFILLED_SYRINGE | INTRAVENOUS | Status: DC | PRN
  Administered 2020-11-12: 5 mg via INTRAVENOUS
  Administered 2020-11-12: 30 mg via INTRAVENOUS

## 2020-11-12 MED ORDER — ONDANSETRON HCL 4 MG/2ML IJ SOLN
INTRAMUSCULAR | Status: DC | PRN
  Administered 2020-11-12: 4 mg via INTRAVENOUS

## 2020-11-12 MED ORDER — OXYCODONE HCL 5 MG PO TABS
5.0000 mg | ORAL_TABLET | Freq: Once | ORAL | Status: AC | PRN
  Administered 2020-11-12: 5 mg via ORAL

## 2020-11-12 MED ORDER — OXYCODONE HCL 5 MG/5ML PO SOLN: 5.0000 mg | Freq: Once | ORAL | Status: AC | PRN

## 2020-11-12 MED ORDER — OXYCODONE HCL 5 MG PO TABS
ORAL_TABLET | ORAL | Status: AC
  Filled 2020-11-12: qty 1

## 2020-11-12 MED ORDER — PROMETHAZINE HCL 25 MG/ML IJ SOLN: 6.2500 mg | INTRAMUSCULAR | Status: DC | PRN

## 2020-11-12 MED ORDER — ORAL CARE MOUTH RINSE: 15.0000 mL | Freq: Once | OROMUCOSAL | Status: AC

## 2020-11-12 MED ORDER — ONDANSETRON HCL 4 MG/2ML IJ SOLN
INTRAMUSCULAR | Status: AC
  Filled 2020-11-12: qty 2

## 2020-11-12 MED ORDER — LIDOCAINE HCL (PF) 2 % IJ SOLN
INTRAMUSCULAR | Status: AC
  Filled 2020-11-12: qty 5

## 2020-11-12 MED ORDER — CHLORHEXIDINE GLUCONATE 0.12 % MT SOLN
15.0000 mL | Freq: Once | OROMUCOSAL | Status: AC
  Administered 2020-11-12: 15 mL via OROMUCOSAL

## 2020-11-12 MED ORDER — SUCCINYLCHOLINE CHLORIDE 200 MG/10ML IV SOSY
PREFILLED_SYRINGE | INTRAVENOUS | Status: DC | PRN
  Administered 2020-11-12: 120 mg via INTRAVENOUS

## 2020-11-12 MED ORDER — MIDAZOLAM HCL 2 MG/2ML IJ SOLN
INTRAMUSCULAR | Status: DC | PRN
  Administered 2020-11-12: 2 mg via INTRAVENOUS

## 2020-11-12 MED ORDER — IOHEXOL 300 MG/ML  SOLN
INTRAMUSCULAR | Status: DC | PRN
  Administered 2020-11-12: 10 mL

## 2020-11-12 MED ORDER — SUGAMMADEX SODIUM 200 MG/2ML IV SOLN
INTRAVENOUS | Status: DC | PRN
  Administered 2020-11-12: 300 mg via INTRAVENOUS

## 2020-11-12 MED ORDER — FENTANYL CITRATE (PF) 100 MCG/2ML IJ SOLN
INTRAMUSCULAR | Status: DC | PRN
  Administered 2020-11-12 (×2): 50 ug via INTRAVENOUS

## 2020-11-12 MED ORDER — DEXAMETHASONE SODIUM PHOSPHATE 10 MG/ML IJ SOLN
INTRAMUSCULAR | Status: AC
  Filled 2020-11-12: qty 1

## 2020-11-12 MED ORDER — DEXAMETHASONE SODIUM PHOSPHATE 10 MG/ML IJ SOLN
INTRAMUSCULAR | Status: DC | PRN
  Administered 2020-11-12: 10 mg via INTRAVENOUS

## 2020-11-12 MED ORDER — FENTANYL CITRATE (PF) 100 MCG/2ML IJ SOLN
25.0000 ug | INTRAMUSCULAR | Status: DC | PRN
Start: 2020-11-12 — End: 2020-11-12
  Administered 2020-11-12: 50 ug via INTRAVENOUS

## 2020-11-12 MED ORDER — SCOPOLAMINE 1 MG/3DAYS TD PT72
MEDICATED_PATCH | TRANSDERMAL | Status: AC
  Filled 2020-11-12: qty 1

## 2020-11-12 MED ORDER — MIDAZOLAM HCL 2 MG/2ML IJ SOLN
INTRAMUSCULAR | Status: AC
  Filled 2020-11-12: qty 2

## 2020-11-12 MED ORDER — PROPOFOL 10 MG/ML IV BOLUS
INTRAVENOUS | Status: DC | PRN
  Administered 2020-11-12: 200 mg via INTRAVENOUS

## 2020-11-12 MED ORDER — HYDROCODONE-ACETAMINOPHEN 5-325 MG PO TABS: 1.0000 | ORAL_TABLET | ORAL | 0 refills | Status: AC | PRN

## 2020-11-12 MED ORDER — ACETAMINOPHEN 500 MG PO TABS
1000.0000 mg | ORAL_TABLET | Freq: Once | ORAL | Status: AC
  Administered 2020-11-12: 1000 mg via ORAL
  Filled 2020-11-12: qty 2

## 2020-11-12 MED ORDER — SCOPOLAMINE 1 MG/3DAYS TD PT72
MEDICATED_PATCH | TRANSDERMAL | Status: DC | PRN
  Administered 2020-11-12: 1 via TRANSDERMAL

## 2020-11-12 MED ORDER — LACTATED RINGERS IV SOLN: INTRAVENOUS | Status: DC

## 2020-11-12 SURGICAL SUPPLY — 24 items
BAG URO CATCHER STRL LF (MISCELLANEOUS) ×3 IMPLANT
BASKET LASER NITINOL 1.9FR (BASKET) IMPLANT
BASKET ZERO TIP NITINOL 2.4FR (BASKET) ×2 IMPLANT
BSKT STON RTRVL 120 1.9FR (BASKET)
BSKT STON RTRVL ZERO TP 2.4FR (BASKET) ×1
CATH INTERMIT  6FR 70CM (CATHETERS) ×3 IMPLANT
CLOTH BEACON ORANGE TIMEOUT ST (SAFETY) ×3 IMPLANT
EXTRACTOR STONE 1.7FRX115CM (UROLOGICAL SUPPLIES) IMPLANT
GLOVE SURG ENC MOIS LTX SZ7.5 (GLOVE) ×3 IMPLANT
GOWN STRL REUS W/TWL XL LVL3 (GOWN DISPOSABLE) ×3 IMPLANT
GUIDEWIRE ANG ZIPWIRE 038X150 (WIRE) IMPLANT
GUIDEWIRE STR DUAL SENSOR (WIRE) ×3 IMPLANT
KIT TURNOVER KIT A (KITS) ×3 IMPLANT
LASER FIB FLEXIVA PULSE ID 365 (Laser) IMPLANT
MANIFOLD NEPTUNE II (INSTRUMENTS) ×3 IMPLANT
PACK CYSTO (CUSTOM PROCEDURE TRAY) ×3 IMPLANT
SHEATH URETERAL 12FRX28CM (UROLOGICAL SUPPLIES) IMPLANT
SHEATH URETERAL 12FRX35CM (MISCELLANEOUS) ×2 IMPLANT
STENT URET 6FRX26 CONTOUR (STENTS) ×2 IMPLANT
TRACTIP FLEXIVA PULS ID 200XHI (Laser) IMPLANT
TRACTIP FLEXIVA PULSE ID 200 (Laser) ×3
TUBING CONNECTING 10 (TUBING) ×2 IMPLANT
TUBING CONNECTING 10' (TUBING) ×1
TUBING UROLOGY SET (TUBING) ×3 IMPLANT

## 2020-11-12 NOTE — Anesthesia Procedure Notes (Signed)
Procedure Name: Intubation Date/Time: 11/12/2020 12:36 PM Performed by: Sharlette Dense, CRNA Patient Re-evaluated:Patient Re-evaluated prior to induction Oxygen Delivery Method: Circle system utilized Preoxygenation: Pre-oxygenation with 100% oxygen Induction Type: IV induction Ventilation: Mask ventilation without difficulty and Oral airway inserted - appropriate to patient size Laryngoscope Size: Sabra Heck and 2 Grade View: Grade I Tube type: Oral Tube size: 7.5 mm Number of attempts: 1 Airway Equipment and Method: Stylet Placement Confirmation: ETT inserted through vocal cords under direct vision,  positive ETCO2 and breath sounds checked- equal and bilateral Secured at: 21 cm Tube secured with: Tape Dental Injury: Teeth and Oropharynx as per pre-operative assessment

## 2020-11-12 NOTE — Anesthesia Preprocedure Evaluation (Addendum)
Anesthesia Evaluation  Patient identified by MRN, date of birth, ID band Patient awake    Reviewed: Allergy & Precautions, NPO status , Patient's Chart, lab work & pertinent test results  History of Anesthesia Complications Negative for: history of anesthetic complications  Airway Mallampati: III  TM Distance: >3 FB Neck ROM: Full    Dental  (+) Dental Advisory Given   Pulmonary neg pulmonary ROS,    breath sounds clear to auscultation       Cardiovascular hypertension, Pt. on medications  Rhythm:Regular Rate:Normal     Neuro/Psych Anxiety Depression negative neurological ROS     GI/Hepatic negative GI ROS, Neg liver ROS,   Endo/Other  diabetes, Type 2, Oral Hypoglycemic AgentsMorbid obesity (BMI 62)  Renal/GU Renal InsufficiencyRenal disease (Cr 1.86)  negative genitourinary   Musculoskeletal  (+) Arthritis ,   Abdominal   Peds  Hematology negative hematology ROS (+)   Anesthesia Other Findings Day of surgery medications reviewed with patient.  Reproductive/Obstetrics negative OB ROS                            Anesthesia Physical Anesthesia Plan  ASA: III  Anesthesia Plan: General   Post-op Pain Management:    Induction: Intravenous  PONV Risk Score and Plan: 3 and Treatment may vary due to age or medical condition, Ondansetron, Dexamethasone and Midazolam  Airway Management Planned: Oral ETT  Additional Equipment: None  Intra-op Plan:   Post-operative Plan: Extubation in OR  Informed Consent: I have reviewed the patients History and Physical, chart, labs and discussed the procedure including the risks, benefits and alternatives for the proposed anesthesia with the patient or authorized representative who has indicated his/her understanding and acceptance.     Dental advisory given  Plan Discussed with: CRNA  Anesthesia Plan Comments:        Anesthesia Quick  Evaluation

## 2020-11-12 NOTE — H&P (Signed)
CC/HPI: CC: History ofRecurrent urinary tract infections. elevated creatinine  HPI:  45 year old female who has been having multiple urinary tract infections since January. She does take a tub bath daily. She has diabetes but states that this is well-controlled. She states that when she has an infection, she feels like she has incomplete emptying, uncomfortable feeling in her bladder, and occasional leakage. She denies hematuria. She states she has been told that she has staph in her urine. She states she is constantly been on antibiotics including Macrobid and Bactrim since January. She has no other complaints. At baseline, she has mild stress urinary incontinence that is not bothersome. Urinalysis today shows 10-20 WBCs, 25-50, bacteria, and 10-20 epithelial cells, which may be consistent with contamination.   09/28/2020  History as above. The patient self-referred herself after she got renal function test back recently. This revealed a creatinine of 1.73 and a GFR of 27. She is very concerned about this. She has not had a recheck. She does have microscopic hematuria today. She had appendicitis back in October and a CT scan revealed small nonobstructing right renal calculi. She also had left renal calculi measuring up to 1.5 cm total. She would like to proceed with ureteroscopy for the left-sided renal stone burden. She is getting a CT on Monday of the abdomen and pelvis due to weight loss/decreased appetite as well as change in renal function. she states that when she was on trimethoprim, she was not having urinary tract infections. Since stopping it, she has had several urinary tract infections.     ALLERGIES: None   MEDICATIONS: Lisinopril 40 mg tablet  Metformin Hcl 500 mg tablet  Contrave  Hydrocodone-Acetaminophen  Ibuprofen  Vitamin D3     GU PSH: None   NON-GU PSH: Appendectomy (laparoscopic)     GU PMH: Chronic cystitis (w/o hematuria) - 01/29/2019 Stress Incontinence - 01/29/2019     NON-GU PMH: Anxiety Depression Diabetes Type 2 Hypertension    FAMILY HISTORY: Colon Cancer - Father Diabetes - Grandmother, Father Hypertension - Father Lung Cancer - Father pneumonia - Mother stroke - Father   SOCIAL HISTORY: Marital Status: Single Preferred Language: English; Race: White Current Smoking Status: Patient has never smoked.   Tobacco Use Assessment Completed: Used Tobacco in last 30 days? Drinks 2 caffeinated drinks per day.    REVIEW OF SYSTEMS:    GU Review Female:   Patient denies frequent urination, hard to postpone urination, burning /pain with urination, get up at night to urinate, leakage of urine, stream starts and stops, trouble starting your stream, have to strain to urinate, and being pregnant.  Gastrointestinal (Upper):   Patient denies nausea, vomiting, and indigestion/ heartburn.  Gastrointestinal (Lower):   Patient denies diarrhea and constipation.  Constitutional:   Patient denies fever, night sweats, weight loss, and fatigue.  Skin:   Patient denies skin rash/ lesion and itching.  Eyes:   Patient denies blurred vision and double vision.  Ears/ Nose/ Throat:   Patient denies sore throat and sinus problems.  Hematologic/Lymphatic:   Patient denies swollen glands and easy bruising.  Cardiovascular:   Patient denies chest pains and leg swelling.  Respiratory:   Patient denies cough and shortness of breath.  Endocrine:   Patient denies excessive thirst.  Musculoskeletal:   Patient denies back pain and joint pain.  Neurological:   Patient denies headaches and dizziness.  Psychologic:   Patient denies depression and anxiety.   VITAL SIGNS:      09/28/2020 03:18 PM  Weight 312 lb / 141.52 kg  Height 60 in / 152.4 cm  BP 160/93 mmHg  Pulse 116 /min  Temperature 97.1 F / 36.1 C  BMI 60.9 kg/m   MULTI-SYSTEM PHYSICAL EXAMINATION:    Gastrointestinal: No mass, no tenderness, no rigidity, Morbidly obese abdomen.      Complexity of Data:   Source Of History:  Patient  Records Review:   Previous Patient Records  Urine Test Review:   Urinalysis  X-Ray Review: C.T. Abdomen/Pelvis: Reviewed Films. Reviewed Report. Discussed With Patient.     PROCEDURES:          Urinalysis w/Scope Dipstick Dipstick Cont'd Micro  Color: Amber Bilirubin: Neg mg/dL WBC/hpf: 0 - 5/hpf  Appearance: Cloudy Ketones: Neg mg/dL RBC/hpf: 3 - 10/hpf  Specific Gravity: 1.025 Blood: 3+ ery/uL Bacteria: Many (>50/hpf)  pH: 6.0 Protein: 1+ mg/dL Cystals: NS (Not Seen)  Glucose: Neg mg/dL Urobilinogen: 0.2 mg/dL Casts: NS (Not Seen)    Nitrites: Neg Trichomonas: Not Present    Leukocyte Esterase: 1+ leu/uL Mucous: Not Present      Epithelial Cells: 20 - 40/hpf      Yeast: NS (Not Seen)      Sperm: Not Present    ASSESSMENT:      ICD-10 Details  1 GU:   Chronic kidney disease stage 4 (GFR 15-30) - N18.4 Undiagnosed New Problem  2   Chronic cystitis (w/o hematuria) - N30.20 Chronic, Stable  3   Renal calculus - N20.0 Undiagnosed New Problem   PLAN:           Orders Labs BMP, Urine Culture          Document Letter(s):  Created for Patient: Clinical Summary         Notes:   she would like to proceed with left ureteroscopy with laser lithotripsy and ureteral stent placement. Risks and benefits discussed including but not limited to bleeding, infection, injury to surrounding structures including ureteral avulsion, need for additional procedures.   Recheck BMP today. Referred to Nephrology if persistent elevation in creatinine and GFR that warrants referral. She is scheduled for a CT of the abdomen and pelvis on Monday. This will further evaluate hydronephrosis as well.   urine culture today    Signed by Link Snuffer, III, M.D. on 09/28/20 at 3:58 PM (EST

## 2020-11-12 NOTE — Anesthesia Postprocedure Evaluation (Signed)
Anesthesia Post Note  Patient: Karen Gibson  Procedure(s) Performed: CYSTOSCOPY LEFT URETEROSCOPY/RETROGRADE PYELOGRAM LEFT/HOLMIUM LASER/STENT PLACEMENT (Left )     Patient location during evaluation: PACU Anesthesia Type: General Level of consciousness: awake and alert Pain management: pain level controlled Vital Signs Assessment: post-procedure vital signs reviewed and stable Respiratory status: spontaneous breathing, nonlabored ventilation, respiratory function stable and patient connected to nasal cannula oxygen Cardiovascular status: blood pressure returned to baseline and stable Postop Assessment: no apparent nausea or vomiting Anesthetic complications: no   No complications documented.  Last Vitals:  Vitals:   11/12/20 1436 11/12/20 1452  BP: (!) 159/102 (!) 153/97  Pulse: 85 91  Resp: 16 14  Temp:    SpO2: 99% 98%    Last Pain:  Vitals:   11/12/20 1436  TempSrc:   PainSc: 8                  Tiajuana Amass

## 2020-11-12 NOTE — Transfer of Care (Signed)
Immediate Anesthesia Transfer of Care Note  Patient: Karen Gibson  Procedure(s) Performed: CYSTOSCOPY LEFT URETEROSCOPY/RETROGRADE PYELOGRAM LEFT/HOLMIUM LASER/STENT PLACEMENT (Left )  Patient Location: PACU  Anesthesia Type:General  Level of Consciousness: awake and alert   Airway & Oxygen Therapy: Patient Spontanous Breathing and Patient connected to face mask oxygen  Post-op Assessment: Report given to RN and Post -op Vital signs reviewed and stable  Post vital signs: Reviewed and stable  Last Vitals:  Vitals Value Taken Time  BP 133/116 11/12/20 1330  Temp    Pulse 91 11/12/20 1331  Resp    SpO2 100 % 11/12/20 1331  Vitals shown include unvalidated device data.  Last Pain:  Vitals:   11/12/20 1036  TempSrc:   PainSc: 8          Complications: No complications documented.

## 2020-11-12 NOTE — Discharge Instructions (Addendum)
General Anesthesia, Adult, Care After This sheet gives you information about how to care for yourself after your procedure. Your health care provider may also give you more specific instructions. If you have problems or questions, contact your health care provider. What can I expect after the procedure? After the procedure, the following side effects are common:  Pain or discomfort at the IV site.  Nausea.  Vomiting.  Sore throat.  Trouble concentrating.  Feeling cold or chills.  Feeling weak or tired.  Sleepiness and fatigue.  Soreness and body aches. These side effects can affect parts of the body that were not involved in surgery. Follow these instructions at home: For the time period you were told by your health care provider:  Rest.  Do not participate in activities where you could fall or become injured.  Do not drive or use machinery.  Do not drink alcohol.  Do not take sleeping pills or medicines that cause drowsiness.  Do not make important decisions or sign legal documents.  Do not take care of children on your own.   Eating and drinking  Follow any instructions from your health care provider about eating or drinking restrictions.  When you feel hungry, start by eating small amounts of foods that are soft and easy to digest (bland), such as toast. Gradually return to your regular diet.  Drink enough fluid to keep your urine pale yellow.  If you vomit, rehydrate by drinking water, juice, or clear broth. General instructions  If you have sleep apnea, surgery and certain medicines can increase your risk for breathing problems. Follow instructions from your health care provider about wearing your sleep device: ? Anytime you are sleeping, including during daytime naps. ? While taking prescription pain medicines, sleeping medicines, or medicines that make you drowsy.  Have a responsible adult stay with you for the time you are told. It is important to have  someone help care for you until you are awake and alert.  Return to your normal activities as told by your health care provider. Ask your health care provider what activities are safe for you.  Take over-the-counter and prescription medicines only as told by your health care provider.  If you smoke, do not smoke without supervision.  Keep all follow-up visits as told by your health care provider. This is important. Contact a health care provider if:  You have nausea or vomiting that does not get better with medicine.  You cannot eat or drink without vomiting.  You have pain that does not get better with medicine.  You are unable to pass urine.  You develop a skin rash.  You have a fever.  You have redness around your IV site that gets worse. Get help right away if:  You have difficulty breathing.  You have chest pain.  You have blood in your urine or stool, or you vomit blood. Summary  After the procedure, it is common to have a sore throat or nausea. It is also common to feel tired.  Have a responsible adult stay with you for the time you are told. It is important to have someone help care for you until you are awake and alert.  When you feel hungry, start by eating small amounts of foods that are soft and easy to digest (bland), such as toast. Gradually return to your regular diet.  Drink enough fluid to keep your urine pale yellow.  Return to your normal activities as told by your health care provider.   Ask your health care provider what activities are safe for you. This information is not intended to replace advice given to you by your health care provider. Make sure you discuss any questions you have with your health care provider. Document Revised: 05/27/2020 Document Reviewed: 12/25/2019 Elsevier Patient Education  2021 [facility] [phone] Post Ureteroscopy With or Without Stent Instructions  Remove your stent by gently  pulling on the string on Monday morning  Definitions:  Ureter: The duct that transports urine from the kidney to the bladder. Stent:   A plastic hollow tube that is placed into the ureter, from the kidney to the                 bladder to prevent the ureter from swelling shut.  GENERAL INSTRUCTIONS:  Despite the fact that no skin incisions were used, the area around the ureter and bladder is raw and irritated. The stent is a foreign body which will further irritate the bladder wall. This irritation is manifested by increased frequency of urination, both day and night, and by an increase in the urge to urinate. In some, the urge to urinate is present almost always. Sometimes the urge is strong enough that you may not be able to stop yourself from urinating. The only real cure is to remove the stent and then give time for the bladder wall to heal which can't be done until the danger of the ureter swelling shut has passed, which varies.  You may see some blood in your urine while the stent is in place and a few days afterwards. Do not be alarmed, even if the urine was clear for a while. Get off your feet and drink lots of fluids until clearing occurs. If you start to pass clots or don't improve, call us.  DIET: You may return to your normal diet immediately. Because of the raw surface of your bladder, alcohol, spicy foods, acid type foods and drinks with caffeine may cause irritation or frequency and should be used in moderation. To keep your urine flowing freely and to avoid constipation, drink plenty of fluids during the day ( 8-10 glasses ). Tip: Avoid cranberry juice because it is very acidic.  ACTIVITY: Your physical activity doesn't need to be restricted. However, if you are very active, you may see some blood in your urine. We suggest that you reduce your activity under these circumstances until the bleeding has stopped.  BOWELS: It is important to keep your bowels regular during the  postoperative period. Straining with bowel movements can cause bleeding. A bowel movement every other day is reasonable. Use a mild laxative if needed, such as Milk of Magnesia 2-3 tablespoons, or 2 Dulcolax tablets. Call if you continue to have problems. If you have been taking narcotics for pain, before, during or after your surgery, you may be constipated. Take a laxative if necessary.   MEDICATION: You should resume your pre-surgery medications unless told not to. You may take oxybutynin or flomax if prescribed for bladder spasms or discomfort from the stent Take pain medication as directed for pain refractory to conservative management  PROBLEMS YOU SHOULD REPORT TO [place]:  Fevers over 100.5 Fahrenheit.  Heavy bleeding, or clots ( See above notes about blood in urine ).  Inability to urinate.  Drug reactions ( hives, rash, nausea, vomiting, diarrhea ).  Severe burning or pain with urination that is not improving.

## 2020-11-12 NOTE — Op Note (Signed)
Operative Note  Preoperative diagnosis:  1.  Left renal calculus  Postoperative diagnosis: 1.  Left renal calculus  Procedure(s): 1.  Cystoscopy with left retrograde pyelogram, left ureteroscopy with laser lithotripsy, ureteral stent placement  Surgeon: Link Snuffer, MD  Assistants: None  Anesthesia: General  Complications: None immediate  EBL: Minimal  Specimens: 1.  None  Drains/Catheters: 1.  6 x 26 double-J ureteral stent on a string  Intraoperative findings: 1.  Normal urethra and bladder 2.  Retrograde pyelogram revealed some fullness of the upper pole of the left kidney.  Remainder the kidney not well opacified. 3.  Ureteroscopy revealed a lower pole approximately 7 mm calculus that was fragmented to tiny fragments.  Indication: 45 year old female with abdominal pain was found to have a left renal calculus and a tiny right renal calculus.  She elected to undergo ureteroscopy for the left side stone burden.  Description of procedure:  The patient was identified and consent was obtained.  The patient was taken to the operating room and placed in the supine position.  The patient was placed under general anesthesia.  Perioperative antibiotics were administered.  The patient was placed in dorsal lithotomy.  Patient was prepped and draped in a standard sterile fashion and a timeout was performed.  A 21 French rigid cystoscope was advanced into the urethra and into the bladder.  Complete cystoscopy was performed with no abnormal findings.  The left ureter was cannulated with a sensor wire which was advanced up to the kidney under fluoroscopic guidance.  A second sensor wire was advanced alongside this into the kidney.  1 the wires was secured to the drape as a safety wire and the other wire was used to pass a 12 x 14 access sheath over the wire under continuous fluoroscopic guidance up to the renal pelvis.  The inner sheath along with the wire were withdrawn.  Digital ureteroscopy  revealed the stone of interest.  It was in the lower pole and difficult access and so I used a stone basket to grasp the stone and reposition it to the upper pole.  I then laser fragmented the stone to tiny fragments.  I inspected the remainder of the kidney and no clinically significant stone fragments remained.  I shot a retrograde pyelogram through the scope with findings noted above.  I then withdrew the scope along with the access sheath visualizing the entire ureter upon removal.  There were no ureteral calculi or ureteral injury noted.  I then placed a 6 x 24 double-J ureteral stent over the wire under fluoroscopic guidance.  Fluoroscopy confirmed a good coil within the bladder and good placement in the proximal kidney.  This concluded the operation.  Patient tolerated the procedure well and stable postoperative.    Plan: She will remove the stent on Monday morning.  Follow-up in 4 weeks with renal ultrasound and KUB.

## 2020-11-13 ENCOUNTER — Encounter (HOSPITAL_COMMUNITY): Payer: Self-pay | Admitting: Urology

## 2021-02-14 ENCOUNTER — Ambulatory Visit: Payer: Managed Care, Other (non HMO) | Admitting: Skilled Nursing Facility1

## 2021-12-28 ENCOUNTER — Other Ambulatory Visit (HOSPITAL_BASED_OUTPATIENT_CLINIC_OR_DEPARTMENT_OTHER): Payer: Self-pay | Admitting: Physician Assistant

## 2021-12-28 DIAGNOSIS — Z1231 Encounter for screening mammogram for malignant neoplasm of breast: Secondary | ICD-10-CM

## 2022-01-09 ENCOUNTER — Inpatient Hospital Stay (HOSPITAL_BASED_OUTPATIENT_CLINIC_OR_DEPARTMENT_OTHER): Admission: RE | Admit: 2022-01-09 | Payer: Managed Care, Other (non HMO) | Source: Ambulatory Visit

## 2022-01-10 ENCOUNTER — Ambulatory Visit (HOSPITAL_BASED_OUTPATIENT_CLINIC_OR_DEPARTMENT_OTHER): Payer: Managed Care, Other (non HMO)

## 2022-01-17 ENCOUNTER — Encounter: Payer: Self-pay | Admitting: General Practice

## 2022-01-18 ENCOUNTER — Encounter: Payer: Self-pay | Admitting: General Practice

## 2022-04-03 ENCOUNTER — Encounter: Payer: 59 | Admitting: Family Medicine

## 2022-05-22 ENCOUNTER — Encounter: Payer: Self-pay | Admitting: Family Medicine

## 2022-05-22 ENCOUNTER — Encounter: Payer: 59 | Admitting: Family Medicine

## 2022-05-23 NOTE — Progress Notes (Signed)
Patient did not keep appointment today. She may call to reschedule.  

## 2022-05-24 ENCOUNTER — Encounter: Payer: Self-pay | Admitting: General Practice

## 2024-04-11 ENCOUNTER — Encounter: Payer: Self-pay | Admitting: Advanced Practice Midwife

## 2024-08-08 ENCOUNTER — Emergency Department (HOSPITAL_BASED_OUTPATIENT_CLINIC_OR_DEPARTMENT_OTHER)

## 2024-08-08 ENCOUNTER — Emergency Department (HOSPITAL_BASED_OUTPATIENT_CLINIC_OR_DEPARTMENT_OTHER)
Admission: EM | Admit: 2024-08-08 | Discharge: 2024-08-08 | Disposition: A | Discharge status: Home / Self Care | Attending: Emergency Medicine | Admitting: Emergency Medicine

## 2024-08-08 ENCOUNTER — Encounter (HOSPITAL_BASED_OUTPATIENT_CLINIC_OR_DEPARTMENT_OTHER): Payer: Self-pay | Admitting: Emergency Medicine

## 2024-08-08 ENCOUNTER — Other Ambulatory Visit: Payer: Self-pay

## 2024-08-08 DIAGNOSIS — I2693 Single subsegmental pulmonary embolism without acute cor pulmonale: Secondary | ICD-10-CM | POA: Insufficient documentation

## 2024-08-08 DIAGNOSIS — Z7901 Long term (current) use of anticoagulants: Secondary | ICD-10-CM | POA: Insufficient documentation

## 2024-08-08 DIAGNOSIS — R0602 Shortness of breath: Secondary | ICD-10-CM | POA: Diagnosis present

## 2024-08-08 LAB — COMPREHENSIVE METABOLIC PANEL WITH GFR
ALT: 30 U/L (ref 0–44)
AST: 32 U/L (ref 15–41)
Albumin: 4.5 g/dL (ref 3.5–5.0)
Alkaline Phosphatase: 108 U/L (ref 38–126)
Anion gap: 16 — ABNORMAL HIGH (ref 5–15)
BUN: 22 mg/dL — ABNORMAL HIGH (ref 6–20)
CO2: 19 mmol/L — ABNORMAL LOW (ref 22–32)
Calcium: 9.7 mg/dL (ref 8.9–10.3)
Chloride: 104 mmol/L (ref 98–111)
Creatinine, Ser: 1.8 mg/dL — ABNORMAL HIGH (ref 0.44–1.00)
GFR, Estimated: 34 mL/min — ABNORMAL LOW (ref >60)
Glucose, Bld: 84 mg/dL (ref 70–99)
Potassium: 4.1 mmol/L (ref 3.5–5.1)
Sodium: 140 mmol/L (ref 135–145)
Total Bilirubin: 0.7 mg/dL (ref 0.0–1.2)
Total Protein: 7.9 g/dL (ref 6.5–8.1)

## 2024-08-08 LAB — HCG, SERUM, QUALITATIVE: Preg, Serum: NEGATIVE

## 2024-08-08 LAB — CBC
HCT: 37.7 % (ref 36.0–46.0)
Hemoglobin: 13.6 g/dL (ref 12.0–15.0)
MCH: 32.9 pg (ref 26.0–34.0)
MCHC: 36.1 g/dL — ABNORMAL HIGH (ref 30.0–36.0)
MCV: 91.1 fL (ref 80.0–100.0)
Platelets: 290 K/uL (ref 150–400)
RBC: 4.14 MIL/uL (ref 3.87–5.11)
RDW: 13.1 % (ref 11.5–15.5)
WBC: 9 K/uL (ref 4.0–10.5)
nRBC: 0 % (ref 0.0–0.2)

## 2024-08-08 LAB — IRON AND TIBC
Iron: 116 ug/dL (ref 28–170)
Saturation Ratios: 38 % — ABNORMAL HIGH (ref 10.4–31.8)
TIBC: 304 ug/dL (ref 250–450)
UIBC: 188 ug/dL

## 2024-08-08 LAB — D-DIMER, QUANTITATIVE: D-Dimer, Quant: 1.17 ug{FEU}/mL — ABNORMAL HIGH (ref 0.00–0.50)

## 2024-08-08 LAB — PRO BRAIN NATRIURETIC PEPTIDE: Pro Brain Natriuretic Peptide: <50 pg/mL (ref <300.0)

## 2024-08-08 LAB — TROPONIN T, HIGH SENSITIVITY: Troponin T High Sensitivity: <15 ng/L (ref 0–19)

## 2024-08-08 MED ORDER — APIXABAN 5 MG PO TABS: ORAL_TABLET | ORAL | 0 refills | Status: AC

## 2024-08-08 MED ORDER — SODIUM CHLORIDE 0.9 % IV BOLUS
1000.0000 mL | Freq: Once | INTRAVENOUS | Status: AC
  Administered 2024-08-08: 1000 mL via INTRAVENOUS

## 2024-08-08 MED ORDER — IOHEXOL 350 MG/ML SOLN
70.0000 mL | Freq: Once | INTRAVENOUS | Status: AC | PRN
  Administered 2024-08-08: 70 mL via INTRAVENOUS

## 2024-08-08 MED ORDER — APIXABAN 2.5 MG PO TABS
10.0000 mg | ORAL_TABLET | Freq: Once | ORAL | Status: AC
  Administered 2024-08-08: 10 mg via ORAL
  Filled 2024-08-08: qty 4

## 2024-08-08 NOTE — ED Notes (Signed)
Patient verbalizes understanding of discharge instructions. Opportunity for questioning and answers were provided. Armband removed by staff, pt discharged from ED. Ambulated out to lobby  

## 2024-08-08 NOTE — Discharge Instructions (Addendum)
 You were in the emergency department today for concerns of shortness of breath.  You unfortunately had findings of a pulmonary embolism, or a blood clot in your lungs seen on your CT scan.  I have started you on a course of blood thinners that you will take as directed for the next month.  After this, you should remain on this medication for a total of 3 months but refills will be provided by your primary care provider.  If you notice significant changes in her symptoms such as development of chest pain or shortness of breath that is worsening, return to the emergency department.

## 2024-08-08 NOTE — ED Notes (Signed)
 ED Provider at bedside.

## 2024-08-08 NOTE — ED Triage Notes (Signed)
 Pt c/o shob x 3 days. Denies new cough, known sick contacts.   Hx of iron deficiency.

## 2024-08-08 NOTE — ED Provider Notes (Signed)
 [place] [facility] AT [facility] Provider Note   CSN: [id number] Arrival date & time: 08/08/24  1906     Patient presents with: Shortness of Breath   Karen Gibson is a 48 y.o. female.  Patient presents to the emergency department with concerns of shortness of breath.  Past medical history significant for diabetes, obesity, CKD, hypertension.  She reports that she has been feeling increasing shortness of breath on exertion for the last 3 days.  No reported fever, chills, congestion, sore throat.  Denies any sick contacts.  She does endorse a history of iron deficiency anemia as well as vitamin D deficiency.  Denies any recent changes in her home medications.  No reported surgery or immobilization recently.  Denies any significant weight gain.  She is currently on Ozempic and phentermine for weight loss.   Shortness of Breath      Prior to Admission medications   Medication Sig Start Date End Date Taking? Authorizing Provider  apixaban (ELIQUIS) 5 MG TABS tablet Take 2 tablets (10mg ) twice daily for 7 days, then 1 tablet (5mg ) twice daily 08/08/24  Yes Kymere Fullington A, PA-C  acetaminophen  (TYLENOL ) 325 MG tablet You can take 650 mg every 6 hours as needed for pain. DO NOT TAKE MORE THAN 4000 MG OF TYLENOL  PER DAY.  IT CAN HARM YOUR LIVER.  TYLENOL  (ACETAMINOPHEN ) IS ALSO IN YOUR PRESCRIPTION PAIN MEDICATION.  YOU HAVE TO COUNT IT IN YOUR DAILY TOTAL. Patient taking differently: Take 650 mg by mouth See admin instructions. You can take 650 mg every 6 hours as needed for pain. DO NOT TAKE MORE THAN 4000 MG OF TYLENOL  PER DAY.  IT CAN HARM YOUR LIVER.  TYLENOL  (ACETAMINOPHEN ) IS ALSO IN YOUR PRESCRIPTION PAIN MEDICATION.  YOU HAVE TO COUNT IT IN YOUR DAILY TOTAL. 05/11/20   Tonnie George, PA-C  amoxicillin -clavulanate (AUGMENTIN ) 875-125 MG tablet Take 1 tablet every 12 hours for the next 72 hours. Patient not taking: No sig reported 05/11/20   Tonnie George,  PA-C  buPROPion  (WELLBUTRIN  XL) 300 MG 24 hr tablet Take 300 mg by mouth daily. 05/05/20   [provider]  CONTRAVE 8-90 MG TB12 Take 2 tablets by mouth 2 (two) times daily. 03/13/20   [provider]  HYDROcodone -acetaminophen  (NORCO) 5-325 MG tablet Take 1 tablet by mouth every 4 (four) hours as needed for severe pain. 06/15/19   Curatolo, Adam, DO  HYDROcodone -acetaminophen  (NORCO) 5-325 MG tablet Take 1 tablet by mouth every 4 (four) hours as needed for moderate pain. 11/12/20   Carolee Sherwood JONETTA DOUGLAS, MD  ibuprofen  (ADVIL ) 800 MG tablet Do not resume this until your primary care doctor see's you and allows you to use it again. Patient taking differently: Take 800 mg by mouth 2 (two) times daily. 05/11/20   Tonnie George, PA-C  lisinopril  (ZESTRIL ) 40 MG tablet Decrease your home dose of this to 1/2 tablet per day.  Record your blood pressures at home 3-4 times a day and take with you to your primary care physician. Patient taking differently: Take 20 mg by mouth 2 (two) times daily. Record your blood pressures at home 3-4 times a day and take with you to your primary care physician. 05/11/20   Tonnie George, PA-C  metFORMIN  (GLUCOPHAGE ) 500 MG tablet Take 1 tablet (500 mg total) by mouth 2 (two) times daily with a meal. 10/14/18   Palumbo, April, MD  methocarbamol  (ROBAXIN ) 500 MG tablet Take 1 tablet (500 mg total)  by mouth 3 (three) times daily. Patient not taking: No sig reported 05/11/20   Tonnie George, PA-C  nitrofurantoin, macrocrystal-monohydrate, (MACROBID) 100 MG capsule Take 100 mg by mouth 2 (two) times daily. 10/19/20   [provider]  ondansetron  (ZOFRAN ) 4 MG tablet Take 4 mg by mouth 3 (three) times daily as needed for nausea. 04/20/20   [provider]  promethazine  (PHENERGAN ) 25 MG tablet Take 25 mg by mouth 3 (three) times daily as needed for vomiting.  04/25/20   [provider]  saccharomyces boulardii (FLORASTOR) 250 MG capsule  This is the probiotic we spoke about.  It helps replace good bacteria in your colon.  You can buy this in the stool section at any drugstore.  Follow package directions.  I would use it for the next 2 weeks. Patient not taking: No sig reported 05/11/20   Tonnie George, PA-C  tiZANidine  (ZANAFLEX ) 4 MG capsule Take 4 mg by mouth 2 (two) times daily as needed for muscle spasms. 02/07/20   [provider]  Vitamin D, Ergocalciferol, (DRISDOL) 1.25 MG (50000 UNIT) CAPS capsule Take 50,000 Units by mouth once a week. Sat/Sun 04/16/20   [provider]    Allergies: Bee venom    Review of Systems  Respiratory:  Positive for shortness of breath.   All other systems reviewed and are negative.   Updated Vital Signs BP 107/61   Pulse 97   Temp 98.3 F (36.8 C)   Resp 19   Ht 5' (1.524 m)   Wt 116.1 kg   SpO2 100%   BMI 50.00 kg/m   Physical Exam Vitals and nursing note reviewed.  Constitutional:      General: She is not in acute distress.    Appearance: She is well-developed.  HENT:     Head: Normocephalic and atraumatic.  Eyes:     Conjunctiva/sclera: Conjunctivae normal.  Cardiovascular:     Rate and Rhythm: Normal rate and regular rhythm.     Heart sounds: No murmur heard. Pulmonary:     Effort: Pulmonary effort is normal. No respiratory distress.     Breath sounds: Normal breath sounds. No decreased breath sounds, wheezing, rhonchi or rales.  Abdominal:     Palpations: Abdomen is soft.     Tenderness: There is no abdominal tenderness.  Musculoskeletal:        General: No swelling.     Cervical back: Neck supple.     Right lower leg: No tenderness. No edema.     Left lower leg: No tenderness. No edema.  Skin:    General: Skin is warm and dry.     Capillary Refill: Capillary refill takes less than 2 seconds.  Neurological:     Mental Status: She is alert.  Psychiatric:        Mood and Affect: Mood normal.     (all labs ordered are listed, but only  abnormal results are displayed) Labs Reviewed  CBC - Abnormal; Notable for the following components:      Result Value   MCHC 36.1 (*)    All other components within normal limits  COMPREHENSIVE METABOLIC PANEL WITH GFR - Abnormal; Notable for the following components:   CO2 19 (*)    BUN 22 (*)    Creatinine, Ser 1.80 (*)    GFR, Estimated 34 (*)    Anion gap 16 (*)    All other components within normal limits  D-DIMER, QUANTITATIVE - Abnormal; Notable for the following  components:   D-Dimer, Quant 1.17 (*)    All other components within normal limits  IRON AND TIBC - Abnormal; Notable for the following components:   Saturation Ratios 38 (*)    All other components within normal limits  PRO BRAIN NATRIURETIC PEPTIDE  HCG, SERUM, QUALITATIVE  TROPONIN T, HIGH SENSITIVITY    EKG: EKG Interpretation Date/Time:  Friday August 08 2024 19:17:22 EST Ventricular Rate:  100 PR Interval:  157 QRS Duration:  93 QT Interval:  323 QTC Calculation: 417 R Axis:   65  Text Interpretation: Sinus tachycardia Right atrial enlargement minimal ST depression laterally SINCE LAST TRACING HEART RATE HAS INCREASED Confirmed by Lenor Hollering [id number]) on 08/08/2024 7:19:20 PM  Radiology: CT Angio Chest PE W and/or Wo Contrast Result Date: 08/08/2024 EXAM: CTA CHEST 08/08/2024 09:20:00 PM TECHNIQUE: CTA of the chest was performed without and with the administration of 70 mL of intravenous contrast (iohexol  (OMNIPAQUE ) 350 MG/ML injection 70 mL IOHEXOL  350 MG/ML SOLN). Multiplanar reformatted images are provided for review. MIP images are provided for review. Automated exposure control, iterative reconstruction, and/or weight based adjustment of the mA/kV was utilized to reduce the radiation dose to as low as reasonably achievable. COMPARISON: PA and lateral chest 10/26/2018 and CTA chest 10/01/2017. CLINICAL HISTORY: Pulmonary embolism (PE) suspected, low to intermediate prob, positive D-dimer.  Shortness of breath for 3 days without known sick contacts. FINDINGS: PULMONARY ARTERIES: Pulmonary arteries are adequately opacified for evaluation. There is a small clot burden in the right lower lobe. This consists of a nonocclusive subsegmental branch point embolus in the superior right lower lobe segment on series 6 axial 57 through 59, and another subsegmental branch point embolus in the posterior basal right lower lobe on images 68 through 70. No other embolus is detected elsewhere. Main pulmonary artery is normal in caliber. MEDIASTINUM: The heart demonstrates no acute abnormality. The cardiac size is upper limits of normal. No visible coronary calcification or substantial pericardial effusion. There are no findings of acute right heart strain. The aorta and great vessels are normal. LYMPH NODES: No mediastinal, hilar or axillary lymphadenopathy. LUNGS AND PLEURA: The lungs are clear with no consolidation, effusion, nodule or pneumothorax. UPPER ABDOMEN: In the abdomen, the liver is mildly steatotic and prominent and there is a borderline cirrhotic configuration. No acute upper abdominal abnormality. No other significant findings. SOFT TISSUES AND BONES: There is extensive bridging and enthesopathy of the thoracic spine but no acute osseous abnormality. There is severe acquired left foraminal stenosis at T10-T11 and T11-T12, severe bilateral foraminal stenosis at T2-T3 and T3-T4. No acute soft tissue abnormality. IMPRESSION: 1. Small pulmonary emboli in right lower lobe subsegmental branches without signs of acute right heart strain. 2. Severe thoracic neural foraminal stenosis at T10-T11 and T11-T12 on the left, and at T2-T3 and T3-T4 bilaterally. 3. Hepatic steatosis with borderline cirrhotic configuration. Consider hepatology evaluation and hepatic elastography for fibrosis assessment. Electronically signed by: Francis Quam MD 08/08/2024 09:43 PM EST RP Workstation: [id number]     Procedures    Medications Ordered in the ED  sodium chloride  0.9 % bolus 1,000 mL (0 mLs Intravenous Stopped 08/08/24 2236)  iohexol  (OMNIPAQUE ) 350 MG/ML injection 70 mL (70 mLs Intravenous Contrast Given 08/08/24 2122)  apixaban (ELIQUIS) tablet 10 mg (10 mg Oral Given 08/08/24 2246)  Medical Decision Making Amount and/or Complexity of Data Reviewed Labs: ordered. Radiology: ordered.  Risk Prescription drug management.   This patient presents to the ED for concern of shortness of breath, this involves an extensive number of treatment options, and is a complaint that carries with it a high risk of complications and morbidity.  The differential diagnosis includes ACS, PE, pneumonia, bronchitis, obesity hypoventilation syndrome   Co morbidities that complicate the patient evaluation  Type 2 diabetes, obesity, CKD, hypertension   Additional history obtained:  Additional history obtained from chart review   Lab Tests:  I Ordered, and personally interpreted labs.  The pertinent results include: CBC unremarkable, CMP with baseline renal dysfunction with creatinine 1.8 and GFR 34, D-dimer elevated at 1.17, troponin negative at less than 15, proBNP negative at less than 50, UCG negative, iron and TIBC pending   Imaging Studies ordered:  I ordered imaging studies including CT angio chest PE I independently visualized and interpreted imaging which showed small pulmonary emboli in right lower lobe subsegmental branches without signs of acute right heart strain. 2. Severe thoracic neural foraminal stenosis at T10-T11 and T11-T12 on the left, and at T2-T3 and T3-T4 bilaterally. 3. Hepatic steatosis with borderline cirrhotic configuration. Consider hepatology evaluation and hepatic elastography for fibrosis assessment. I agree with the radiologist interpretation   Cardiac Monitoring: / EKG:  The patient was maintained on a cardiac monitor.  I personally viewed  and interpreted the cardiac monitored which showed an underlying rhythm of: Sinus tachycardia, minimal ST depression laterally   Consultations Obtained:  I requested consultation with none,  and discussed lab and imaging findings as well as pertinent plan - they recommend: N/A   Problem List / ED Course / Critical interventions / Medication management  Patient presents to the emergency department with concerns of shortness of breath.  Nursing ongoing shortness of breath for the last 3 days primarily present with exertion.  No reported orthopnea.  Denies history of CHF or PE.  She is not currently on any blood thinner.  No reported leg swelling, hemoptysis, cough, congestion, or sore throat. Physical exam is largely reassuring with normal heart or lung sounds.  No appreciable lower extremity swelling or edema. Patient's workup is largely reassuring however patient's D-dimer is elevated at 1.17.  Unclear if this is due to patient's CKD versus true concerns for PE.  Given associated shortness of breath, will obtain CT angio for further assessment.  Fluid bolus initiated given patient's poor renal function.  She is still above 30 on the GFR so can be imaged. CT angio chest is concerning for small pulmonary emboli in the right lower lobe subsegmental branches without signs of acute right heart strain.  Patient is currently not hypoxic, tachypneic, tachycardic, afebrile.  Pressures on remained stable.  Based on PESI score class I, outpatient follow-up can be considered.  Will discuss with patient for consideration for admission versus outpatient follow-up with PCP.  Patient does report that she has close follow-up with her PCP so this may be reasonable option for her. After discussion with patient, she would prefer to be discharged.  I feel this is reasonable given patient has no signs of acute respiratory distress with dyspnea of pulmonary embolism.  Will start patient on initial dose of Eliquis here in the  emergency department with starter pack sent to pharmacy.  Advised patient that she should follow-up closely with her PCP within the next week for further evaluation and management.  Return precautions advised such  as concerns for worsening symptoms.  She is otherwise stable for outpatient follow-up and discharged home. I ordered medication including fluids, Eliquis for hydration, PE Reevaluation of the patient after these medicines showed that the patient improved I have reviewed the patients home medicines and have made adjustments as needed   Social Determinants of Health:  None   Test / Admission - Considered:  Considered but after careful decision making with patient and risk stratification, will plan for outpatient follow-up.  Final diagnoses:  Single subsegmental pulmonary embolism without acute cor pulmonale [facility])    ED Discharge Orders          Ordered    apixaban (ELIQUIS) 5 MG TABS tablet        08/08/24 2213               Cecily Legrand LABOR, PA-C 08/08/24 2305    Lenor Hollering, MD 08/08/24 2312
